# Patient Record
Sex: Male | Born: 1960 | Race: White | Hispanic: No | Marital: Married | Smoking: Current every day smoker
Health system: Southern US, Community
[De-identification: ages and names within clinical notes are randomized; demographics above are authoritative.]

## PROBLEM LIST (undated history)

## (undated) DIAGNOSIS — M1990 Unspecified osteoarthritis, unspecified site: Secondary | ICD-10-CM

## (undated) DIAGNOSIS — I10 Essential (primary) hypertension: Secondary | ICD-10-CM

## (undated) DIAGNOSIS — E785 Hyperlipidemia, unspecified: Secondary | ICD-10-CM

## (undated) DIAGNOSIS — K219 Gastro-esophageal reflux disease without esophagitis: Secondary | ICD-10-CM

## (undated) HISTORY — PX: CERVICAL DISC SURGERY: SHX588

## (undated) HISTORY — DX: Hyperlipidemia, unspecified: E78.5

## (undated) HISTORY — DX: Essential (primary) hypertension: I10

## (undated) HISTORY — DX: Gastro-esophageal reflux disease without esophagitis: K21.9

## (undated) HISTORY — DX: Unspecified osteoarthritis, unspecified site: M19.90

---

## 1990-08-10 HISTORY — PX: BLADDER SURGERY: SHX569

## 2005-02-12 ENCOUNTER — Ambulatory Visit: Payer: Self-pay | Admitting: Family Medicine

## 2005-04-10 ENCOUNTER — Emergency Department: Payer: Self-pay | Admitting: Emergency Medicine

## 2005-04-21 ENCOUNTER — Ambulatory Visit: Payer: Self-pay | Admitting: Internal Medicine

## 2005-05-21 ENCOUNTER — Ambulatory Visit: Payer: Self-pay | Admitting: Internal Medicine

## 2007-05-24 ENCOUNTER — Ambulatory Visit: Payer: Self-pay | Admitting: Chiropractic Medicine

## 2008-07-19 ENCOUNTER — Ambulatory Visit: Payer: Self-pay | Admitting: General Practice

## 2008-08-10 HISTORY — PX: NECK SURGERY: SHX720

## 2008-08-27 ENCOUNTER — Ambulatory Visit (HOSPITAL_COMMUNITY): Admission: RE | Admit: 2008-08-27 | Discharge: 2008-08-28 | Payer: Self-pay | Admitting: Neurosurgery

## 2008-10-02 ENCOUNTER — Encounter: Admission: RE | Admit: 2008-10-02 | Discharge: 2008-10-02 | Payer: Self-pay | Admitting: Neurosurgery

## 2008-11-02 ENCOUNTER — Encounter: Admission: RE | Admit: 2008-11-02 | Discharge: 2008-11-02 | Payer: Self-pay | Admitting: Neurosurgery

## 2010-11-24 LAB — CBC
Hemoglobin: 15.6 g/dL (ref 13.0–17.0)
RBC: 4.78 MIL/uL (ref 4.22–5.81)
WBC: 8.5 10*3/uL (ref 4.0–10.5)

## 2010-12-23 NOTE — Op Note (Signed)
NAMESOLLY, DERASMO NO.:  [id number]   MEDICAL RECORD NO.:  [id number]          PATIENT TYPE:  INP   LOCATION:  3536                         FACILITY:  [facility]   PHYSICIAN:  Donalee Citrin, M.D.        DATE OF BIRTH:  [birth year]   DATE OF PROCEDURE:  08/27/2008  DATE OF DISCHARGE:                               OPERATIVE REPORT   PREOPERATIVE DIAGNOSIS:  C6-C7 radiculopathy from cervical spondylosis  with stenosis of the C5-6 and C6-7.   PROCEDURE:  Anterior cervical diskectomy and fusion at C5-6 and C6-7  using 7-mm allograft wedges at C5-6 and C6-7, and a 40-mm venture plate  with six 30-mm variable screws.   SURGEON:  Donalee Citrin, MD   ASSISTANT:  Kathaleen Maser. Pool, MD   ANESTHESIA:  General endotracheal.   HISTORY OF PRESENT ILLNESS:  The patient is a very pleasant 50 year old  gentleman who has had progressive worsening neck and bilateral left arm  pain radiating to his shoulder, down to his forearm, to his thumb and  first fingers of his left hand.  The patient had weakness of his triceps  preoperatively and MRI scan showed severe spondylitic compression of the  C6 and C7 nerve roots respectively, worse on the left at C6-7, worse  than the right at C5-6 with severe stenosis of spinal cord compression  of both levels.  Given the patient's failure with conservative  treatment, MRI findings, and physical exam, the patient was recommended  anterior cervical discectomy with fusion.  Risks and benefits of the  operation were discussed with the patient and he understood and agreed  to proceed forward.   The patient was brought to the OR, was induced under general anesthesia,  positioned supine, with then neck in slight extension and 5 pounds of  halter traction.  The right side of the neck was prepped and draped in  routine sterile fashion.  Preop incision was localized at the  appropriate level.  A curvilinear incision was made and just off the  midline to the  anterior border of the sternocleidomastoid.  The  superficial layer of the platysma was dissected out and divided  longitudinally.  The avascular plane between the sternocleidomastoid and  strap muscle was developed down to the prevertebral fascia.  Prevertebral fascia was dissected with Kitners.  Intraoperative x-ray  confirmed localization of the C5-6 disk space, so the disk space was  incised with a 15-blade scalpel and used a marked anterior osteophytes  were bitten off the C5-6 and C6-7 vertebral bodies.  Then using a 2-mm  Kerrison punch, further anterior osteophytes were bitten off the  undersurface of the C5 and C6 vertebral bodies respectively and the both  interspaces were drilled down the posterior annulus and osteophyte  complex.  First working under microscope illumination at C6-7, the  posterior osteophyte complexes were drilled down.  Using a 1 to 2-mm  Kerrison punch, the undersurface of both C6 and C7 vertebral bodies were  underbitten.  The PLL was then identified and removed in piecemeal  fashion exposing the  thecal sac.  There was a marked spondylosis with  stenosis and compression spinal cord from the large osteophyte coming  from the posterior aspect of the C6 vertebral body.  This was  aggressively underbitten, marked and crossed through the uncinate  process, which was also hypertrophied,  and noticed to be displacing  both C7 neural foramina.  These uncinate was then underbitten.  The C7  pedicle was identified and C7 nerve root was skeletonized, flushed with  a pedicle, then aggressive underbiting was carried out the posterior  aspect of the C7 vertebral body until the central canal was  decompressed.  Gelfoam was placed and attention was taken to C5-6,  working with C5-6 in similar fashion.  A large osteophyte was coming off  the C5 vertebral body and the uncinate process superiorly on the right  size, this was all aggressively underbitten, decompressing the  right C6  nerve root.  They marked and crossed his left decompressing the left C6  nerve root and nerve hook easily passed along the foramen, flushed with  a pedicle.  After adequate decompression centrally had been achieved,  the endplates were scraped, a size 7-mm allograft wedge was inserted 1  mm deep to the anterior vertebral line both at C5-6 and C6-7, then a 40-  mm plate was sized, it was felt to be appropriate sizing six 30-mm  variable screws were placed.  All screws had excellent purchase and  locking mechanism was engaged.  Wound was then copiously irrigated and  meticulous hemostasis was maintained and the platysma was reapproximated  with interrupted Vicryl and the skin was closed with running 4-0  subcuticular, benzoin and Steri-Strips applied.  The patient went to  recovery room in stable condition.  At the end of the case, sponge,  needle, and instrument counts were correct.           ______________________________  Donalee Citrin, M.D.     GC/MEDQ  D:  08/27/2008  T:  08/28/2008  Job:  [id number]

## 2011-08-11 HISTORY — PX: SHOULDER SURGERY: SHX246

## 2011-10-17 ENCOUNTER — Ambulatory Visit: Payer: Self-pay | Admitting: General Practice

## 2011-12-10 ENCOUNTER — Encounter: Payer: Self-pay | Admitting: Orthopedic Surgery

## 2012-01-09 ENCOUNTER — Encounter: Payer: Self-pay | Admitting: Orthopedic Surgery

## 2012-02-08 ENCOUNTER — Encounter: Payer: Self-pay | Admitting: Orthopedic Surgery

## 2012-03-10 ENCOUNTER — Encounter: Payer: Self-pay | Admitting: Orthopedic Surgery

## 2012-04-10 ENCOUNTER — Encounter: Payer: Self-pay | Admitting: Orthopedic Surgery

## 2012-05-10 ENCOUNTER — Encounter: Payer: Self-pay | Admitting: Orthopedic Surgery

## 2012-07-06 ENCOUNTER — Ambulatory Visit: Payer: Self-pay | Admitting: General Practice

## 2012-08-01 ENCOUNTER — Ambulatory Visit: Payer: Self-pay | Admitting: General Practice

## 2014-01-11 LAB — HM COLONOSCOPY

## 2014-04-10 ENCOUNTER — Ambulatory Visit: Payer: Self-pay | Admitting: General Practice

## 2014-04-11 ENCOUNTER — Other Ambulatory Visit: Payer: Self-pay | Admitting: Neurology

## 2014-04-11 DIAGNOSIS — R519 Headache, unspecified: Secondary | ICD-10-CM | POA: Insufficient documentation

## 2014-04-11 DIAGNOSIS — R200 Anesthesia of skin: Secondary | ICD-10-CM | POA: Insufficient documentation

## 2014-04-11 DIAGNOSIS — R0789 Other chest pain: Secondary | ICD-10-CM | POA: Insufficient documentation

## 2014-04-11 DIAGNOSIS — R51 Headache: Secondary | ICD-10-CM

## 2014-04-11 LAB — TROPONIN I

## 2014-04-11 LAB — CK-MB: CK-MB: 1 ng/mL (ref 0.5–3.6)

## 2014-04-19 ENCOUNTER — Ambulatory Visit: Payer: Self-pay | Admitting: Internal Medicine

## 2014-11-16 ENCOUNTER — Ambulatory Visit
Admit: 2014-11-16 | Disposition: A | Discharge status: Home / Self Care | Payer: Self-pay | Attending: General Practice | Admitting: General Practice

## 2015-04-26 ENCOUNTER — Telehealth: Payer: Self-pay | Admitting: Family Medicine

## 2015-04-26 NOTE — Telephone Encounter (Signed)
Called pt's wife and made appt for 05/03/15 @ 3pm. Thanks TNP

## 2015-04-26 NOTE — Telephone Encounter (Signed)
That's fine

## 2015-04-26 NOTE — Telephone Encounter (Signed)
Pt's wife would like to have pt re-established back with Dr. Sherrie Mustache. I couldn't see how long it has been since pt was seen b/c it looks like it is a paper chart. Pt's wife Dois Davenport is a pt her and so is pt's father in law. Pt had been going to his employee clinic due to insurance but wife feels he needs a CPE with Dr. Sherrie Mustache. Insurance: [employer](?) Current Medications: Amlodipine 5 mg, Nexium, & Fenofibrate (Wife gave this list as best as she remembers). Current Medical problems: high BP, high cholesterol, & has seen a neurologist recently. Can we re-establish? Thanks TNP

## 2015-05-03 ENCOUNTER — Ambulatory Visit (INDEPENDENT_AMBULATORY_CARE_PROVIDER_SITE_OTHER): Payer: PRIVATE HEALTH INSURANCE | Admitting: Family Medicine

## 2015-05-03 ENCOUNTER — Encounter: Payer: Self-pay | Admitting: Family Medicine

## 2015-05-03 VITALS — BP 124/72 | HR 72 | Temp 98.6°F | Resp 16 | Ht 65.5 in | Wt 162.0 lb

## 2015-05-03 DIAGNOSIS — I10 Essential (primary) hypertension: Secondary | ICD-10-CM | POA: Insufficient documentation

## 2015-05-03 DIAGNOSIS — Z0000 Encounter for general adult medical examination without abnormal findings: Secondary | ICD-10-CM | POA: Diagnosis not present

## 2015-05-03 DIAGNOSIS — N400 Enlarged prostate without lower urinary tract symptoms: Secondary | ICD-10-CM | POA: Diagnosis not present

## 2015-05-03 DIAGNOSIS — E781 Pure hyperglyceridemia: Secondary | ICD-10-CM

## 2015-05-03 DIAGNOSIS — M7989 Other specified soft tissue disorders: Secondary | ICD-10-CM | POA: Diagnosis not present

## 2015-05-03 DIAGNOSIS — Z720 Tobacco use: Secondary | ICD-10-CM | POA: Diagnosis not present

## 2015-05-03 DIAGNOSIS — E782 Mixed hyperlipidemia: Secondary | ICD-10-CM | POA: Insufficient documentation

## 2015-05-03 DIAGNOSIS — Z23 Encounter for immunization: Secondary | ICD-10-CM | POA: Diagnosis not present

## 2015-05-03 MED ORDER — AMLODIPINE BESYLATE 5 MG PO TABS: 2.5000 mg | ORAL_TABLET | Freq: Every day | ORAL | Status: DC

## 2015-05-03 MED ORDER — DOXAZOSIN MESYLATE 2 MG PO TABS: 4.0000 mg | ORAL_TABLET | Freq: Every day | ORAL | Status: DC

## 2015-05-03 MED ORDER — BUPROPION HCL ER (SMOKING DET) 150 MG PO TB12: ORAL_TABLET | ORAL | Status: DC

## 2015-05-03 NOTE — Progress Notes (Signed)
Patient ID: Derek Fields, male   DOB: 1961/05/24, 54 y.o.   MRN: [id number]       Patient: Derek Fields, Male    DOB: 08-31-1960, 54 y.o.   MRN: [id number] Visit Date: 05/03/2015  Today's Provider: Mila Merry, MD   Chief Complaint  Patient presents with  . New Patient (Initial Visit)   Subjective:    Annual physical exam DEAUNDRE ALLSTON is a 54 y.o. male who presents today for health maintenance and complete physical. He feels well. He reports exercising not regularly. He reports he is sleeping well. Patient is here to re-establish with the practice. He reports that he used to see Dr. 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 Patient reports that he now has a different job, and would like to continue getting his care here at [facility].  -----------------------------------------------------------------   Review of Systems  Constitutional: Negative.   HENT: Negative.   Eyes: Negative.   Respiratory: Negative.   Cardiovascular: Negative.   Gastrointestinal: Negative.   Endocrine: Negative.   Genitourinary: Negative.   Musculoskeletal: Positive for neck pain.       No change from baseline. Patient has had neck surgery in 2010.   Skin: Negative.   Allergic/Immunologic: Negative.   Neurological: Negative.   Hematological: Negative.   Psychiatric/Behavioral: Negative.     Social History He  reports that he has been smoking Cigarettes.  He has a 20 pack-year smoking history. He has never used smokeless tobacco. He reports that he drinks about 2.4 oz of alcohol per week. He reports that he does not use illicit drugs. Social History   Social History  . Marital Status: Married    Spouse Name: N/A  . Number of Children: N/A  . Years of Education: N/A   Social History Main Topics  . Smoking status: Current Every Day Smoker -- 1.00 packs/day for 20 years    Types: Cigarettes  . Smokeless tobacco: Never Used  . Alcohol Use: 2.4 oz/week    4 Standard  drinks or equivalent per week  . Drug Use: No  . Sexual Activity: Not on file   Other Topics Concern  . Not on file   Social History Narrative  . No narrative on file    There are no active problems to display for this patient.   No past surgical history on file.  Family History  Family Status  Relation Status Death Age  . Mother Deceased   . Father Deceased    His family history includes Cancer in his father; Heart disease in his mother.    No Known Allergies  Previous Medications   AMLODIPINE (NORVASC) 5 MG TABLET    Take 5 mg by mouth daily.   DOXAZOSIN (CARDURA) 2 MG TABLET    Take 2 mg by mouth daily.   ESOMEPRAZOLE (NEXIUM) 40 MG CAPSULE    Take 40 mg by mouth daily.   FENOFIBRATE 160 MG TABLET    Take 160 mg by mouth daily.    Patient Care Team: Malva Limes, MD as PCP - General (Family Medicine)     Objective:   Vitals: BP 124/72 mmHg  Pulse 72  Temp(Src) 98.6 F (37 C)  Resp 16  Ht 5' 5.5" (1.664 m)  Wt 162 lb (73.483 kg)  BMI 26.54 kg/m2   Physical Exam  General Appearance:    Alert, cooperative, no distress, appears stated age  Head:    Normocephalic, without obvious abnormality,  atraumatic  Eyes:    PERRL, conjunctiva/corneas clear, EOM's intact, fundi    benign, both eyes       Ears:    Normal TM's and external ear canals, both ears  Nose:   Nares normal, septum midline, mucosa normal, no drainage   or sinus tenderness  Throat:   Lips, mucosa, and tongue normal; teeth and gums normal  Neck:   Supple, symmetrical, trachea midline, no adenopathy;       thyroid:  No enlargement/tenderness/nodules; no carotid   bruit or JVD  Back:     Symmetric, no curvature, ROM normal, no CVA tenderness  Lungs:     Clear to auscultation bilaterally, respirations unlabored  Chest wall:    No tenderness or deformity  Heart:    Regular rate and rhythm, S1 and S2 normal, no murmur, rub   or gallop  Abdomen:     Soft, non-tender, bowel sounds active all four  quadrants,    no masses, no organomegaly  Genitalia:    deferred  Rectal:    the prostate is enlarged at the bilateral, with an approx volume of 30 gms, negative bulge  Extremities:   Extremities normal, atraumatic, no cyanosis or edema. Vague swelling under left axilla with no discreet masses.   Pulses:   2+ and symmetric all extremities  Skin:   Skin color, texture, turgor normal, no rashes or lesions  Lymph nodes:   Cervical, supraclavicular  Neurologic:   CNII-XII intact. Normal strength, sensation and reflexes      throughout     Depression Screen No flowsheet data found.    Assessment & Plan:     Routine Health Maintenance and Physical Exam  Exercise Activities and Dietary recommendations Goals    None       There is no immunization history on file for this patient.  Health Maintenance  Topic Date Due  . Hepatitis C Screening  1961/06/13  . HIV Screening  06/13/1978  . TETANUS/TDAP  06/13/1982  . COLONOSCOPY  06/13/2013  . INFLUENZA VACCINE  03/11/2015      Discussed health benefits of physical activity, and encouraged him to engage in regular exercise appropriate for his age and condition.    --------------------------------------------------------------------  1. Annual physical exam  - Comprehensive metabolic panel - PSA  2. Essential hypertension Doing well on current dose of amlodipine. He needs to go up on dose of Cardura as below, so will reduce amlodipine.  - EKG 12-Lead - TSH - amLODipine (NORVASC) 5 MG tablet; Take 0.5 tablets (2.5 mg total) by mouth daily.  Dispense: 1 tablet; Refill: 3  3. Tobacco abuse Smoking cessation instruction/counseling given:  counseled patient on the dangers of tobacco use, advised patient to stop smoking, and reviewed strategies to maximize success - buPROPion (ZYBAN) 150 MG 12 hr tablet; 1 tablet daily for 3 days, then increase to 1 tablet twice daily. Stop smoking 2 weeks after starting medication  Dispense: 60  tablet; Refill: 3  4. Left axillary swelling Ultrasound axilla  5. Hypertriglyceridemia Did not tolerate atorvastatin. Doing well with fenofibrate.  - Lipid panel  6. BPH (benign prostatic hyperplasia) Not controlled on current dose of cardura. He is going to double up on dose and call for rx for  tablet if this is effective.  - doxazosin (CARDURA) 2 MG tablet; Take 2 tablets (4 mg total) by mouth daily.  Dispense: 30 tablet; Refill: 5  7. Need for Tdap vaccination - Tdap vaccine greater than or equal  to 7yo IM   8. Need for influenza vaccination - Flu Vaccine QUAD 36+ mos IM

## 2015-05-03 NOTE — Patient Instructions (Signed)
Please stop smoking 

## 2015-05-04 ENCOUNTER — Encounter: Payer: Self-pay | Admitting: Family Medicine

## 2015-05-09 ENCOUNTER — Encounter: Payer: Self-pay | Admitting: Family Medicine

## 2015-05-10 ENCOUNTER — Other Ambulatory Visit: Payer: Self-pay | Admitting: Family Medicine

## 2015-05-10 ENCOUNTER — Telehealth: Payer: Self-pay | Admitting: Family Medicine

## 2015-05-10 DIAGNOSIS — M7989 Other specified soft tissue disorders: Secondary | ICD-10-CM

## 2015-05-10 NOTE — Progress Notes (Signed)
US BREAST LTD UNI LEFT INC AXILLA

## 2015-05-10 NOTE — Telephone Encounter (Signed)
Thanks. That what I needed to know. Have cancelled previous orders and entered new order.

## 2015-05-10 NOTE — Telephone Encounter (Signed)
Per [facility] unless pt has swelling in the breast or chest area the test that should be ordered is a non vascular extremity ultrasound [id number].If you want to keep the test as a breast ultrasound then the diagnosis needs to be changed

## 2015-05-15 ENCOUNTER — Ambulatory Visit: Payer: PRIVATE HEALTH INSURANCE

## 2015-05-20 ENCOUNTER — Ambulatory Visit
Admission: RE | Admit: 2015-05-20 | Discharge: 2015-05-20 | Disposition: A | Discharge status: Home / Self Care | Payer: PRIVATE HEALTH INSURANCE | Source: Ambulatory Visit | Attending: Family Medicine | Admitting: Family Medicine

## 2015-05-20 DIAGNOSIS — M7989 Other specified soft tissue disorders: Secondary | ICD-10-CM | POA: Insufficient documentation

## 2015-05-21 ENCOUNTER — Telehealth: Payer: Self-pay

## 2015-05-21 LAB — COMPREHENSIVE METABOLIC PANEL
A/G RATIO: 2 (ref 1.1–2.5)
ALT: 45 IU/L — ABNORMAL HIGH (ref 0–44)
AST: 28 IU/L (ref 0–40)
Albumin: 4.5 g/dL (ref 3.5–5.5)
Alkaline Phosphatase: 63 IU/L (ref 39–117)
BILIRUBIN TOTAL: 0.2 mg/dL (ref 0.0–1.2)
BUN/Creatinine Ratio: 11 (ref 9–20)
BUN: 13 mg/dL (ref 6–24)
CALCIUM: 9.4 mg/dL (ref 8.7–10.2)
CO2: 25 mmol/L (ref 18–29)
Chloride: 101 mmol/L (ref 97–108)
Creatinine, Ser: 1.14 mg/dL (ref 0.76–1.27)
GFR, EST AFRICAN AMERICAN: 86 mL/min/{1.73_m2} (ref >59)
GFR, EST NON AFRICAN AMERICAN: 74 mL/min/{1.73_m2} (ref >59)
GLOBULIN, TOTAL: 2.2 g/dL (ref 1.5–4.5)
Glucose: 91 mg/dL (ref 65–99)
POTASSIUM: 4.1 mmol/L (ref 3.5–5.2)
SODIUM: 142 mmol/L (ref 134–144)
TOTAL PROTEIN: 6.7 g/dL (ref 6.0–8.5)

## 2015-05-21 LAB — TSH: TSH: 2.98 u[IU]/mL (ref 0.450–4.500)

## 2015-05-21 LAB — LIPID PANEL
CHOL/HDL RATIO: 6.2 ratio — AB (ref 0.0–5.0)
Cholesterol, Total: 341 mg/dL — ABNORMAL HIGH (ref 100–199)
HDL: 55 mg/dL (ref >39)
LDL CALC: 243 mg/dL — AB (ref 0–99)
Triglycerides: 214 mg/dL — ABNORMAL HIGH (ref 0–149)
VLDL Cholesterol Cal: 43 mg/dL — ABNORMAL HIGH (ref 5–40)

## 2015-05-21 LAB — PSA: Prostate Specific Ag, Serum: 1 ng/mL (ref 0.0–4.0)

## 2015-05-21 NOTE — Telephone Encounter (Signed)
Left message to call back  

## 2015-05-21 NOTE — Telephone Encounter (Signed)
-----   Message from Donald E Fisher, MD sent at 05/21/2015  8:14 AM EDT ----- Cholesterol is extremely high at 341. Recommend he change stop fenofibrate and start pravastatin 40mg daily, #30, rf x 1. Also take Conezyme Q10 200mg daily.  Follow up o.v. And labs in 6 weeks.  All other labs normal. 

## 2015-05-22 ENCOUNTER — Telehealth: Payer: Self-pay | Admitting: *Deleted

## 2015-05-22 MED ORDER — COENZYME Q-10 200 MG PO CAPS: 1.0000 | ORAL_CAPSULE | Freq: Every day | ORAL | Status: DC

## 2015-05-22 MED ORDER — PRAVASTATIN SODIUM 40 MG PO TABS: 40.0000 mg | ORAL_TABLET | Freq: Every day | ORAL | Status: DC

## 2015-05-22 NOTE — Telephone Encounter (Signed)
Patient notified of results. Patient expressed understanding. Rx was sent to pharmacy. 

## 2015-05-22 NOTE — Telephone Encounter (Signed)
-----   Message from Malva Limesonald E Fisher, MD sent at 05/21/2015  8:14 AM EDT ----- Cholesterol is extremely high at 341. Recommend he change stop fenofibrate and start pravastatin 40mg  daily, #30, rf x 1. Also take Conezyme Q10 200mg  daily.  Follow up o.v. And labs in 6 weeks.  All other labs normal.

## 2015-05-23 ENCOUNTER — Encounter: Payer: Self-pay | Admitting: Family Medicine

## 2015-05-23 NOTE — Telephone Encounter (Signed)
Patient notified of results. Patient expressed understanding. Rx was sent to pharmacy.

## 2015-05-29 ENCOUNTER — Other Ambulatory Visit: Payer: Self-pay | Admitting: Family Medicine

## 2015-07-22 ENCOUNTER — Other Ambulatory Visit: Payer: Self-pay | Admitting: Family Medicine

## 2015-07-22 DIAGNOSIS — E785 Hyperlipidemia, unspecified: Secondary | ICD-10-CM

## 2015-07-22 NOTE — Telephone Encounter (Signed)
Patient needs to schedule follow up o.v. And labs to see how pravastatin is working.

## 2015-08-02 NOTE — Telephone Encounter (Signed)
Orders printed and pt notified.

## 2015-08-02 NOTE — Telephone Encounter (Signed)
Pt made an appt. Would like to know if he needs his labs done before he comes and if so will need a lab slip and also pravastatin sent to make sure he has enough until his appt.

## 2015-08-02 NOTE — Telephone Encounter (Signed)
Please order fastin lipid panel and ALT so he can have labs drawn before his appointment. Have sent one refill to pharmacy.

## 2015-08-02 NOTE — Telephone Encounter (Signed)
LMTCB 08/02/2015 on home number;  I tried calling his cell number but he hung up on me.   Thanks,   -Vernona RiegerLaura

## 2015-08-14 ENCOUNTER — Ambulatory Visit: Payer: Self-pay | Admitting: Family Medicine

## 2015-08-21 ENCOUNTER — Ambulatory Visit: Payer: Self-pay | Admitting: Family Medicine

## 2015-08-28 ENCOUNTER — Other Ambulatory Visit: Payer: Self-pay | Admitting: Family Medicine

## 2015-08-28 ENCOUNTER — Ambulatory Visit: Payer: Self-pay | Admitting: Family Medicine

## 2015-08-28 ENCOUNTER — Telehealth: Payer: Self-pay

## 2015-08-28 LAB — ALT: ALT: 31 IU/L (ref 0–44)

## 2015-08-28 LAB — LIPID PANEL
CHOL/HDL RATIO: 4.5 ratio (ref 0.0–5.0)
Cholesterol, Total: 266 mg/dL — ABNORMAL HIGH (ref 100–199)
HDL: 59 mg/dL (ref >39)
LDL CALC: 144 mg/dL — AB (ref 0–99)
Triglycerides: 313 mg/dL — ABNORMAL HIGH (ref 0–149)
VLDL Cholesterol Cal: 63 mg/dL — ABNORMAL HIGH (ref 5–40)

## 2015-08-28 MED ORDER — PRAVASTATIN SODIUM 80 MG PO TABS: 80.0000 mg | ORAL_TABLET | Freq: Every day | ORAL | Status: DC

## 2015-08-28 NOTE — Telephone Encounter (Signed)
Advised patient as below. Scheduled patient appt in 8 weeks to have cholesterol and BP repeated. Med sent into pharmacy.

## 2015-08-28 NOTE — Telephone Encounter (Signed)
-----   Message from Malva Limes, MD sent at 08/28/2015  8:07 AM EST ----- Cholesterol is much better, down from 341 to 266. Should be under 200. Need to increase pravastatin to to  daily, #30, rf x 2. Need to schedule follow up for BP check and cholesterol in 6-8 weeks.

## 2015-10-14 ENCOUNTER — Other Ambulatory Visit: Payer: Self-pay | Admitting: Family Medicine

## 2015-10-25 ENCOUNTER — Ambulatory Visit: Payer: Self-pay | Admitting: Family Medicine

## 2015-12-31 ENCOUNTER — Other Ambulatory Visit: Payer: Self-pay | Admitting: Family Medicine

## 2016-03-16 ENCOUNTER — Other Ambulatory Visit: Payer: Self-pay | Admitting: Family Medicine

## 2016-06-02 ENCOUNTER — Other Ambulatory Visit: Payer: Self-pay | Admitting: Family Medicine

## 2016-06-03 ENCOUNTER — Other Ambulatory Visit: Payer: Self-pay | Admitting: Family Medicine

## 2016-08-07 ENCOUNTER — Other Ambulatory Visit: Payer: Self-pay | Admitting: Family Medicine

## 2016-08-29 ENCOUNTER — Other Ambulatory Visit: Payer: Self-pay | Admitting: Family Medicine

## 2016-08-31 ENCOUNTER — Other Ambulatory Visit: Payer: Self-pay | Admitting: Family Medicine

## 2016-09-23 ENCOUNTER — Other Ambulatory Visit: Payer: Self-pay | Admitting: Family Medicine

## 2016-09-30 ENCOUNTER — Other Ambulatory Visit: Payer: Self-pay | Admitting: Family Medicine

## 2016-10-07 ENCOUNTER — Ambulatory Visit (INDEPENDENT_AMBULATORY_CARE_PROVIDER_SITE_OTHER): Payer: Managed Care, Other (non HMO) | Admitting: Family Medicine

## 2016-10-07 ENCOUNTER — Encounter: Payer: Self-pay | Admitting: Family Medicine

## 2016-10-07 VITALS — BP 140/90 | HR 77 | Temp 97.7°F | Resp 16 | Ht 65.0 in | Wt 166.0 lb

## 2016-10-07 DIAGNOSIS — N401 Enlarged prostate with lower urinary tract symptoms: Secondary | ICD-10-CM

## 2016-10-07 DIAGNOSIS — Z1159 Encounter for screening for other viral diseases: Secondary | ICD-10-CM | POA: Diagnosis not present

## 2016-10-07 DIAGNOSIS — R339 Retention of urine, unspecified: Secondary | ICD-10-CM

## 2016-10-07 DIAGNOSIS — K429 Umbilical hernia without obstruction or gangrene: Secondary | ICD-10-CM

## 2016-10-07 DIAGNOSIS — E781 Pure hyperglyceridemia: Secondary | ICD-10-CM | POA: Diagnosis not present

## 2016-10-07 DIAGNOSIS — R140 Abdominal distension (gaseous): Secondary | ICD-10-CM | POA: Insufficient documentation

## 2016-10-07 DIAGNOSIS — Z0000 Encounter for general adult medical examination without abnormal findings: Secondary | ICD-10-CM | POA: Diagnosis not present

## 2016-10-07 DIAGNOSIS — I10 Essential (primary) hypertension: Secondary | ICD-10-CM

## 2016-10-07 NOTE — Patient Instructions (Signed)
   Recommend taking 81mg enteric coated aspirin to reduce risk of vascular events such as heart attacks and strokes.    

## 2016-10-07 NOTE — Progress Notes (Signed)
Patient: Derek Fields, Male    DOB: 18-Dec-1960, 56 y.o.   MRN: [id number] Visit Date: 10/07/2016  Today's Provider: Mila Merry, MD   Chief Complaint  Patient presents with  . Annual Exam  . Hypertension    follow up  . Hyperlipidemia    follow up  . Benign Prostatic Hypertrophy    follow up   Subjective:    Annual physical exam Derek Fields is a 56 y.o. male who presents today for health maintenance and complete physical. He feels fairly well. He reports never exercising. He reports he is sleeping fairly well.  -----------------------------------------------------------------  Hypertension, follow-up:  BP Readings from Last 3 Encounters:  05/03/15 124/72    He was last seen for hypertension 2 years ago.  BP at that visit was 124/72. Management since that visit includes decreasing Amlodipine due to increased dose of Cardura. He reports good compliance with treatment. He is not having side effects.  He is not exercising. He is not adherent to low salt diet.   Outside blood pressures are not being checked. He is experiencing none.  Patient denies chest pain, chest pressure/discomfort, claudication, dyspnea, exertional chest pressure/discomfort, fatigue, irregular heart beat, lower extremity edema, near-syncope, orthopnea, palpitations, paroxysmal nocturnal dyspnea, syncope and tachypnea.   Cardiovascular risk factors include dyslipidemia, hypertension and male gender.  Use of agents associated with hypertension: none.     Weight trend: fluctuating a bit Wt Readings from Last 3 Encounters:  05/03/15 162 lb (73.5 kg)    Current diet: in general, an "unhealthy" diet  ------------------------------------------------------------------------  Lipid/Cholesterol, Follow-up:   Last seen for this1 years ago.  Management changes since that visit include increasing Pravastatin to 80mg  daily. . Last Lipid Panel:    Component Value Date/Time   CHOL 266 (H)  08/27/2015 0831   TRIG 313 (H) 08/27/2015 0831   HDL 59 08/27/2015 0831   CHOLHDL 4.5 08/27/2015 0831   LDLCALC 144 (H) 08/27/2015 0831    Risk factors for vascular disease include hypercholesterolemia and hypertension  He reports good compliance with treatment. He is not having side effects.  Current symptoms include none and have been stable. Weight trend: fluctuating a bit Prior visit with dietician: no Current diet: in general, an "unhealthy" diet Current exercise: none  Wt Readings from Last 3 Encounters:  05/03/15 162 lb (73.5 kg)    ------------------------------------------------------------------- Follow up of BPH:  Patient was last seen for this problem 05/03/2015. Changes made during that visit includes doubling up on his dose of Cardura. However he states he still has trouble with slow urine flow, hesitancy and urgency.   Complains of stomach feeling bloated every day and uncomfortable.   Follow up Tobacco abuse:  Patient was last seen for this problem 05/03/2015. Management during that visit includes giving patient a prescription for Zyban. Patient comes in today reporting that he never started the Zyban due to him reading of possible side effects such as Depression. Patient still smokes.   Review of Systems  Constitutional: Negative for appetite change, chills and fever.  Respiratory: Positive for shortness of breath. Negative for chest tightness and wheezing.   Cardiovascular: Negative for chest pain and palpitations.  Gastrointestinal: Positive for abdominal distention, abdominal pain, anal bleeding and rectal pain. Negative for nausea and vomiting.  Genitourinary: Positive for dysuria and frequency.  Musculoskeletal: Positive for arthralgias.    Social History      He  reports that he has been smoking Cigarettes.  He has a 20.00 pack-year smoking history. He has never used smokeless tobacco. He reports that he drinks about 2.4 oz of alcohol per week . He  reports that he does not use drugs.       Social History   Social History  . Marital status: Married    Spouse name: N/A  . Number of children: 1  . Years of education: N/A   Social History Main Topics  . Smoking status: Current Every Day Smoker    Packs/day: 1.00    Years: 20.00    Types: Cigarettes  . Smokeless tobacco: Never Used     Comment: Started age 89 1 ppd, quit 10 years, restarted  age 63.   Marland Kitchen Alcohol use 2.4 oz/week    4 Standard drinks or equivalent per week  . Drug use: No  . Sexual activity: Not Asked   Other Topics Concern  . None   Social History Narrative  . None    Past Medical History:  Diagnosis Date  . Hyperlipidemia   . Hypertension      Patient Active Problem List   Diagnosis Date Noted  . Essential hypertension 05/03/2015  . Tobacco abuse 05/03/2015  . Left axillary swelling 05/03/2015  . BPH (benign prostatic hyperplasia) 05/03/2015  . Hypertriglyceridemia 05/03/2015  . Headache 04/11/2014  . Circumoral numbness 04/11/2014  . Arm numbness 04/11/2014    Past Surgical History:  Procedure Laterality Date  . BLADDER SURGERY  1992   "trimmed up" bladder outlet  . NECK SURGERY  2010  . SHOULDER SURGERY  2013   right shoulder    Family History        Family Status  Relation Status  . Mother Deceased  . Father Deceased  . Sister Alive  . Brother Alive  . Sister Alive  . Brother Alive  . Brother Alive  . Brother Alive  . Brother Alive        His family history includes Cancer in his father; Heart disease in his mother.     No Known Allergies   Current Outpatient Prescriptions:  .  amLODipine (NORVASC) 5 MG tablet, TAKE 1 TABLET BY MOUTH EVERY DAY, Disp: 30 tablet, Rfl: 2 .  Coenzyme Q-10 200 MG CAPS, Take 1 capsule (200 mg total) by mouth daily., Disp: , Rfl:  .  doxazosin (CARDURA) 2 MG tablet, TAKE 2 TABLETS BY MOUTH EVERY DAY PATIENT NEEDS TO SCHEDULE OFFICE VISIT FOR FOLLOW UP, Disp: 30 tablet, Rfl: 0 .  esomeprazole  (NEXIUM) 40 MG capsule, TAKE ONE CAPSULE BY MOUTH EVERY DAY, Disp: 30 capsule, Rfl: 9 .  pravastatin (PRAVACHOL) 80 MG tablet, TAKE 1 TABLET BY MOUTH DAILY., Disp: 30 tablet, Rfl: 0   Patient Care Team: Malva Limes, MD as PCP - General (Family Medicine)      Objective:   Vitals: BP 140/90 (BP Location: Left Arm, Patient Position: Sitting, Cuff Size: Large)   Pulse 77   Temp 97.7 F (36.5 C) (Oral)   Resp 16   Ht 5\' 5"  (1.651 m)   Wt 166 lb (75.3 kg)   SpO2 96%   BMI 27.62 kg/m    Physical Exam   General Appearance:    Alert, cooperative, no distress, appears stated age  Head:    Normocephalic, without obvious abnormality, atraumatic  Eyes:    PERRL, conjunctiva/corneas clear, EOM's intact, fundi    benign, both eyes       Ears:    Normal TM's and  external ear canals, both ears  Nose:   Nares normal, septum midline, mucosa normal, no drainage   or sinus tenderness  Throat:   Lips, mucosa, and tongue normal; teeth and gums normal  Neck:   Supple, symmetrical, trachea midline, no adenopathy;       thyroid:  No enlargement/tenderness/nodules; no carotid   bruit or JVD  Back:     Symmetric, no curvature, ROM normal, no CVA tenderness  Lungs:     Clear to auscultation bilaterally, respirations unlabored  Chest wall:    No tenderness or deformity  Heart:    Regular rate and rhythm, S1 and S2 normal, no murmur, rub   or gallop  Abdomen:     Soft, non-tender, bowel sounds active all four quadrants,    no masses, no organomegaly  Genitalia:    deferred  Rectal:    deferred  Extremities:   Extremities normal, atraumatic, no cyanosis or edema  Pulses:   2+ and symmetric all extremities  Skin:   Skin color, texture, turgor normal, no rashes or lesions  Lymph nodes:   Cervical, supraclavicular, and axillary nodes normal  Neurologic:   CNII-XII intact. Normal strength, sensation and reflexes      throughout    Depression Screen PHQ 2/9 Scores 10/07/2016  PHQ - 2 Score 0    PHQ- 9 Score 0   Current Exercise Habits: The patient does not participate in regular exercise at present Exercise limited by: None identified     Assessment & Plan:     Routine Health Maintenance and Physical Exam  Exercise Activities and Dietary recommendations Goals    None      Immunization History  Administered Date(s) Administered  . Influenza,inj,Quad PF,36+ Mos 05/03/2015  . Tdap 05/03/2015    Health Maintenance  Topic Date Due  . Hepatitis C Screening  03/14/1963  . HIV Screening  06/13/1978  . INFLUENZA VACCINE  03/10/2016  . COLONOSCOPY  01/12/2024  . TETANUS/TDAP  05/02/2025     Discussed health benefits of physical activity, and encouraged him to engage in regular exercise appropriate for his age and condition.    --------------------------------------------------------------------  1. Annual physical exam  - Comprehensive metabolic panel - Lipid panel - PSA  2. Urinary retention Likely due to BPH. If PSA normal will try increasing dose of doxazocin.   3. Benign prostatic hyperplasia with lower urinary tract symptoms, symptom details unspecified   4. Essential hypertension Stable. May benefit from increased dose of doxazocin. . - EKG 12-Lead  5. Hypertriglyceridemia He is tolerating pravastatin well with no adverse effects.    6. Umbilical hernia without obstruction and without gangrene   7. Bloating Consider abdominal u/s if labs normal.   8. Need for hepatitis C screening test  - Hepatitis C antibody   Mila Merryonald Fisher, MD  [facility]

## 2016-10-08 ENCOUNTER — Encounter: Payer: Self-pay | Admitting: Family Medicine

## 2016-10-08 LAB — COMPREHENSIVE METABOLIC PANEL
A/G RATIO: 2.3 — AB (ref 1.2–2.2)
ALT: 36 IU/L (ref 0–44)
AST: 26 IU/L (ref 0–40)
Albumin: 5.2 g/dL (ref 3.5–5.5)
Alkaline Phosphatase: 82 IU/L (ref 39–117)
BILIRUBIN TOTAL: 0.3 mg/dL (ref 0.0–1.2)
BUN / CREAT RATIO: 8 — AB (ref 9–20)
BUN: 8 mg/dL (ref 6–24)
CHLORIDE: 98 mmol/L (ref 96–106)
CO2: 26 mmol/L (ref 18–29)
Calcium: 10 mg/dL (ref 8.7–10.2)
Creatinine, Ser: 1.05 mg/dL (ref 0.76–1.27)
GFR calc non Af Amer: 81 mL/min/{1.73_m2} (ref >59)
GFR, EST AFRICAN AMERICAN: 93 mL/min/{1.73_m2} (ref >59)
Globulin, Total: 2.3 g/dL (ref 1.5–4.5)
Glucose: 88 mg/dL (ref 65–99)
Potassium: 4.9 mmol/L (ref 3.5–5.2)
Sodium: 143 mmol/L (ref 134–144)
TOTAL PROTEIN: 7.5 g/dL (ref 6.0–8.5)

## 2016-10-08 LAB — LIPID PANEL
CHOLESTEROL TOTAL: 253 mg/dL — AB (ref 100–199)
Chol/HDL Ratio: 4.9 ratio units (ref 0.0–5.0)
HDL: 52 mg/dL (ref >39)
LDL Calculated: 145 mg/dL — ABNORMAL HIGH (ref 0–99)
Triglycerides: 282 mg/dL — ABNORMAL HIGH (ref 0–149)
VLDL Cholesterol Cal: 56 mg/dL — ABNORMAL HIGH (ref 5–40)

## 2016-10-08 LAB — PSA: PROSTATE SPECIFIC AG, SERUM: 1.2 ng/mL (ref 0.0–4.0)

## 2016-10-08 LAB — HEPATITIS C ANTIBODY: Hep C Virus Ab: <0.1 s/co ratio (ref 0.0–0.9)

## 2016-10-14 ENCOUNTER — Encounter: Payer: Self-pay | Admitting: Physician Assistant

## 2016-10-14 ENCOUNTER — Ambulatory Visit: Payer: Self-pay | Admitting: Physician Assistant

## 2016-10-14 VITALS — BP 150/98 | HR 73 | Temp 98.2°F

## 2016-10-14 DIAGNOSIS — J0100 Acute maxillary sinusitis, unspecified: Secondary | ICD-10-CM

## 2016-10-14 MED ORDER — AZITHROMYCIN 250 MG PO TABS: ORAL_TABLET | ORAL | 0 refills | Status: DC

## 2016-10-14 MED ORDER — FLUTICASONE PROPIONATE 50 MCG/ACT NA SUSP: 2.0000 | Freq: Every day | NASAL | 6 refills | Status: DC

## 2016-10-14 NOTE — Progress Notes (Signed)
S: C/o runny nose and congestion for 2-3 days, no fever, chills, cp/sob, v/d; mucus is green and thick, cough is sporadic, throat is itchy, c/o of facial and dental pain. Father in law is on dialysis and lives with them  Using otc meds: saline nasal rinse  O: PE: vitals wnl, nad, perrl eomi, normocephalic, tms dull, nasal mucosa red and swollen, throat injected, neck supple no lymph, lungs c t a, cv rrr, neuro intact  A:  Acute sinusitis   P: drink fluids, continue regular meds , use otc meds of choice, return if not improving in 5 days, return earlier if worsening , zpack, flonase

## 2016-10-15 ENCOUNTER — Encounter: Payer: Self-pay | Admitting: Family Medicine

## 2016-10-18 ENCOUNTER — Other Ambulatory Visit: Payer: Self-pay | Admitting: Family Medicine

## 2016-10-26 ENCOUNTER — Other Ambulatory Visit: Payer: Self-pay | Admitting: Family Medicine

## 2016-11-11 ENCOUNTER — Ambulatory Visit: Payer: Self-pay | Admitting: Physician Assistant

## 2016-11-11 ENCOUNTER — Encounter: Payer: Self-pay | Admitting: Physician Assistant

## 2016-11-11 VITALS — BP 165/100 | HR 90 | Temp 98.3°F

## 2016-11-11 DIAGNOSIS — M436 Torticollis: Secondary | ICD-10-CM

## 2016-11-11 MED ORDER — METHYLPREDNISOLONE 4 MG PO TBPK: ORAL_TABLET | ORAL | 0 refills | Status: DC

## 2016-11-11 MED ORDER — BACLOFEN 10 MG PO TABS: 10.0000 mg | ORAL_TABLET | Freq: Three times a day (TID) | ORAL | 0 refills | Status: DC

## 2016-11-11 NOTE — Progress Notes (Signed)
S: c/o r sided neck and shoulder pain for a week, radiates to r side of head, no numbness or tingling, will wake up with stinging pain into r hand occasionally, no known injury, states he woke up and thought he had a "crick" in his neck; hx of c-spine surgery by Dr Denman George: vitals wnl, nad, cspine is a little tender, decreased rom with hyperextension, r shoulder is spasmed, grips = b/l, n/v intact  A: torticollis  P: medrol dose pack, baclofen, f/u with surgeon if not better with medication, also consider massage therapy, use wet heat

## 2017-08-17 ENCOUNTER — Ambulatory Visit: Payer: Self-pay | Admitting: Emergency Medicine

## 2017-08-17 VITALS — BP 120/80 | HR 71 | Temp 98.2°F | Resp 16

## 2017-08-17 DIAGNOSIS — J209 Acute bronchitis, unspecified: Secondary | ICD-10-CM

## 2017-08-17 MED ORDER — BENZONATATE 100 MG PO CAPS: 100.0000 mg | ORAL_CAPSULE | Freq: Three times a day (TID) | ORAL | 0 refills | Status: DC | PRN

## 2017-08-17 MED ORDER — DOXYCYCLINE HYCLATE 100 MG PO CAPS: 100.0000 mg | ORAL_CAPSULE | Freq: Two times a day (BID) | ORAL | 0 refills | Status: DC

## 2017-08-17 MED ORDER — FLUTICASONE PROPIONATE 50 MCG/ACT NA SUSP: 2.0000 | Freq: Every day | NASAL | 6 refills | Status: DC

## 2017-08-17 NOTE — Progress Notes (Signed)
Subjective.  Patient enters with onset this  Weekend of headaches sore throat and productive cough. He has been taking over-the-count medication  Without  Impvement. He does smoke.  objective.  HEENT exam reveals nasal conestion.  Throat normal.  Neck supple wihtout adenopathy  chest clear to auscultation..  Assessment.  Upper respiratory infection with bronchitis   plan.  Doxycycline 100 twice a day.    Tessalon Perles. Flonase

## 2017-08-31 ENCOUNTER — Other Ambulatory Visit: Payer: Self-pay | Admitting: Family Medicine

## 2017-10-17 ENCOUNTER — Encounter: Payer: Self-pay | Admitting: Emergency Medicine

## 2017-10-17 ENCOUNTER — Emergency Department
Admission: EM | Admit: 2017-10-17 | Discharge: 2017-10-17 | Disposition: A | Discharge status: Home / Self Care | Payer: Managed Care, Other (non HMO) | Attending: Emergency Medicine | Admitting: Emergency Medicine

## 2017-10-17 ENCOUNTER — Other Ambulatory Visit: Payer: Self-pay

## 2017-10-17 DIAGNOSIS — Z79899 Other long term (current) drug therapy: Secondary | ICD-10-CM | POA: Diagnosis not present

## 2017-10-17 DIAGNOSIS — I10 Essential (primary) hypertension: Secondary | ICD-10-CM | POA: Diagnosis not present

## 2017-10-17 DIAGNOSIS — F17210 Nicotine dependence, cigarettes, uncomplicated: Secondary | ICD-10-CM | POA: Insufficient documentation

## 2017-10-17 DIAGNOSIS — T783XXA Angioneurotic edema, initial encounter: Secondary | ICD-10-CM | POA: Diagnosis not present

## 2017-10-17 DIAGNOSIS — R220 Localized swelling, mass and lump, head: Secondary | ICD-10-CM | POA: Diagnosis present

## 2017-10-17 LAB — CBC WITH DIFFERENTIAL/PLATELET
BASOS PCT: 1 %
Basophils Absolute: 0.1 10*3/uL (ref 0–0.1)
Eosinophils Absolute: 0.3 10*3/uL (ref 0–0.7)
Eosinophils Relative: 3 %
HCT: 46.7 % (ref 40.0–52.0)
Hemoglobin: 15.8 g/dL (ref 13.0–18.0)
LYMPHS PCT: 19 %
Lymphs Abs: 2.2 10*3/uL (ref 1.0–3.6)
MCH: 33.1 pg (ref 26.0–34.0)
MCHC: 33.9 g/dL (ref 32.0–36.0)
MCV: 97.7 fL (ref 80.0–100.0)
MONO ABS: 1.4 10*3/uL — AB (ref 0.2–1.0)
MONOS PCT: 12 %
NEUTROS ABS: 7.6 10*3/uL — AB (ref 1.4–6.5)
Neutrophils Relative %: 65 %
Platelets: 301 10*3/uL (ref 150–440)
RBC: 4.78 MIL/uL (ref 4.40–5.90)
RDW: 13.6 % (ref 11.5–14.5)
WBC: 11.6 10*3/uL — ABNORMAL HIGH (ref 3.8–10.6)

## 2017-10-17 LAB — COMPREHENSIVE METABOLIC PANEL
ALT: 51 U/L (ref 17–63)
ANION GAP: 11 (ref 5–15)
AST: 29 U/L (ref 15–41)
Albumin: 5.1 g/dL — ABNORMAL HIGH (ref 3.5–5.0)
Alkaline Phosphatase: 110 U/L (ref 38–126)
BUN: 12 mg/dL (ref 6–20)
CO2: 27 mmol/L (ref 22–32)
Calcium: 9.6 mg/dL (ref 8.9–10.3)
Chloride: 103 mmol/L (ref 101–111)
Creatinine, Ser: 0.99 mg/dL (ref 0.61–1.24)
GFR calc Af Amer: >60 mL/min (ref >60)
Glucose, Bld: 102 mg/dL — ABNORMAL HIGH (ref 65–99)
POTASSIUM: 3.9 mmol/L (ref 3.5–5.1)
Sodium: 141 mmol/L (ref 135–145)
TOTAL PROTEIN: 8.6 g/dL — AB (ref 6.5–8.1)
Total Bilirubin: 0.7 mg/dL (ref 0.3–1.2)

## 2017-10-17 MED ORDER — DIPHENHYDRAMINE HCL 50 MG/ML IJ SOLN
INTRAMUSCULAR | Status: DC
Start: 2017-10-17 — End: 2017-10-17
  Filled 2017-10-17: qty 1

## 2017-10-17 MED ORDER — EPINEPHRINE 0.3 MG/0.3ML IJ SOAJ: 0.3000 mg | Freq: Once | INTRAMUSCULAR | 1 refills | Status: AC

## 2017-10-17 MED ORDER — DIPHENHYDRAMINE HCL 50 MG/ML IJ SOLN
50.0000 mg | Freq: Once | INTRAMUSCULAR | Status: AC
  Administered 2017-10-17: 50 mg via INTRAVENOUS

## 2017-10-17 MED ORDER — METHYLPREDNISOLONE SODIUM SUCC 125 MG IJ SOLR
INTRAMUSCULAR | Status: AC
  Filled 2017-10-17: qty 2

## 2017-10-17 MED ORDER — RANITIDINE HCL 300 MG PO TABS: 300.0000 mg | ORAL_TABLET | Freq: Every day | ORAL | 1 refills | Status: DC

## 2017-10-17 MED ORDER — METHYLPREDNISOLONE SODIUM SUCC 125 MG IJ SOLR
125.0000 mg | Freq: Once | INTRAMUSCULAR | Status: AC
  Administered 2017-10-17: 125 mg via INTRAVENOUS

## 2017-10-17 MED ORDER — FAMOTIDINE IN NACL 20-0.9 MG/50ML-% IV SOLN
20.0000 mg | Freq: Once | INTRAVENOUS | Status: AC
  Administered 2017-10-17: 20 mg via INTRAVENOUS

## 2017-10-17 MED ORDER — FAMOTIDINE IN NACL 20-0.9 MG/50ML-% IV SOLN
INTRAVENOUS | Status: AC
  Administered 2017-10-17: 20 mg via INTRAVENOUS
  Filled 2017-10-17: qty 50

## 2017-10-17 MED ORDER — PREDNISONE 20 MG PO TABS: 20.0000 mg | ORAL_TABLET | Freq: Every day | ORAL | 0 refills | Status: DC

## 2017-10-17 NOTE — Discharge Instructions (Signed)
please take the steroids for the next couple days. Use Benadryl 2 of  the over-the-counter pills 4 times a day if you have any swelling left and Zantac. if you are taking the Benadryl. Please be sure to follow up with an allergist. if you develop swelling and shortness of breath use the EpiPen immediately. Then call 911. Return here if swelling returns but it's not causing any shortness of breath. Make sure you carry the EpiPen with you all the time just in case.

## 2017-10-17 NOTE — ED Notes (Signed)
Pt states he is feeling better at this time, feels like swelling is resolving in his tongue.

## 2017-10-17 NOTE — ED Notes (Signed)
NAD noted at time of D/C. Pt denies questions or concerns. Pt ambulatory to the lobby at this time.  

## 2017-10-17 NOTE — ED Triage Notes (Signed)
Pt presents to ED via POV, ambulatory from triage with c/o L sided neck swelling and pain. Pt states had 1 episode last week that resolved on its own, however woke up this morning with some tingling to his tongue that turned into swelling. Pt with mild noted slurred speech due to tongue swelling.

## 2017-10-17 NOTE — ED Provider Notes (Signed)
[facility] Emergency Department Provider Note   ____________________________________________   First MD Initiated Contact with Patient 10/17/17 1018     (approximate)  I have reviewed the triage vital signs and the nursing notes.   HISTORY  Chief Complaint Facial Swelling Chief complaint is swelling of the tongue  HPI Derek Fields is a 57 y.o. male who reports on Monday he had some swelling of his tongue and his lips that went down spontaneously And one other time since then and now again today worse than ever before his left side of his tongue is began to swell and he has some swelling of his left side of his face as well. He is not itching any where he is not short of breath and swallowing just can't speak well because of this tongue swelling. He is not taking any Ace inhibitors has not used any new foods or detergents is unsure why this is happening.in the past has had numbness around his mouth and neurology felt was due to "tearing of the blood vessels in his brain".   Past Medical History:  Diagnosis Date  . Hyperlipidemia   . Hypertension     Patient Active Problem List   Diagnosis Date Noted  . Umbilical hernia 10/07/2016  . Bloating 10/07/2016  . Essential hypertension 05/03/2015  . Tobacco abuse 05/03/2015  . Left axillary swelling 05/03/2015  . BPH (benign prostatic hyperplasia) 05/03/2015  . Mixed hyperlipidemia 05/03/2015  . Headache 04/11/2014  . Circumoral numbness 04/11/2014  . Arm numbness 04/11/2014    Past Surgical History:  Procedure Laterality Date  . BLADDER SURGERY  1992   "trimmed up" bladder outlet  . NECK SURGERY  2010  . SHOULDER SURGERY  2013   right shoulder    Prior to Admission medications   Medication Sig Start Date End Date Taking? Authorizing Provider  amLODipine (NORVASC) 5 MG tablet TAKE 1 TABLET BY MOUTH EVERY DAY 06/03/16  Yes Malva Limes, MD  doxazosin (CARDURA) 2 MG tablet Take 2 tablets (4 mg  total) by mouth daily. 10/18/16  Yes Malva Limes, MD  esomeprazole (NEXIUM) 40 MG capsule TAKE ONE CAPSULE BY MOUTH EVERY DAY 08/31/17  Yes Malva Limes, MD  fluticasone (FLONASE) 50 MCG/ACT nasal spray Place 2 sprays into both nostrils daily. 08/17/17  Yes Collene Gobble, MD  pravastatin (PRAVACHOL) 80 MG tablet TAKE 1 TABLET BY MOUTH DAILY. 10/26/16  Yes Malva Limes, MD  benzonatate (TESSALON) 100 MG capsule Take 1-2 capsules (100-200 mg total) by mouth 3 (three) times daily as needed for cough. Patient not taking: Reported on 10/17/2017 08/17/17   Collene Gobble, MD  doxycycline (VIBRAMYCIN) 100 MG capsule Take 1 capsule (100 mg total) by mouth 2 (two) times daily. Patient not taking: Reported on 10/17/2017 08/17/17   Collene Gobble, MD  EPINEPHrine (EPIPEN 2-PAK) 0.3 mg/0.3 mL IJ SOAJ injection Inject 0.3 mLs (0.3 mg total) into the muscle once for 1 dose. 10/17/17 10/17/17  Arnaldo Natal, MD  predniSONE (DELTASONE) 20 MG tablet Take 1 tablet (20 mg total) by mouth daily. 10/17/17 10/17/18  Arnaldo Natal, MD  ranitidine (ZANTAC) 300 MG tablet Take 1 tablet (300 mg total) by mouth at bedtime. 10/17/17 10/17/18  Arnaldo Natal, MD    Allergies Patient has no known allergies.  Family History  Problem Relation Age of Onset  . Heart disease Mother   . Cancer Father  Brain cancer.    Social History Social History   Tobacco Use  . Smoking status: Current Every Day Smoker    Packs/day: 1.00    Years: 20.00    Pack years: 20.00    Types: Cigarettes  . Smokeless tobacco: Never Used  . Tobacco comment: Stareted age 57 1ppd, quit 10 years, restarted  age 57.   Substance Use Topics  . Alcohol use: Yes    Alcohol/week: 2.4 oz    Types: 4 Standard drinks or equivalent per week  . Drug use: No    Review of Systems  Constitutional: No fever/chills Eyes: No visual changes. ENT: No sore throat. Cardiovascular: Denies chest pain. Respiratory: Denies shortness of  breath. Gastrointestinal: No abdominal pain.  No nausea, no vomiting.  No diarrhea.  No constipation. Genitourinary: Negative for dysuria. Musculoskeletal: Negative for back pain. Skin: Negative for rash. Neurological: Negative for headaches, focal weakness ____________________________________________   PHYSICAL EXAM:  VITAL SIGNS: ED Triage Vitals  Enc Vitals Group     BP      Pulse      Resp      Temp      Temp src      SpO2      Weight      Height      Head Circumference      Peak Flow      Pain Score      Pain Loc      Pain Edu?      Excl. in GC?     Constitutional: Alert and oriented. Well appearing and in no acute distress. Eyes: Conjunctivae are normal. PER. EOMI. Head: Atraumatic. Nose: No congestion/rhinnorhea. Mouth/Throat: Mucous membranes are moist.  Oropharynx non-erythematous. Neck: No stridor.  Cardiovascular: Normal rate, regular rhythm. Grossly normal heart sounds.  Good peripheral circulation. Respiratory: Normal respiratory effort.  No retractions. Lungs CTAB. Gastrointestinal: Soft and nontender. No distention. No abdominal bruits. No CVA tenderness. Musculoskeletal: No lower extremity tenderness nor edema.  No joint effusions. Neurologic:  Normal speech and language. No gross focal neurologic deficits are appreciated.  Skin:  Skin is warm, dry and intact. No rash noted. Psychiatric: Mood and affect are normal. Speech and behavior are normal.  ____________________________________________   LABS (all labs ordered are listed, but only abnormal results are displayed)  Labs Reviewed  CBC WITH DIFFERENTIAL/PLATELET - Abnormal; Notable for the following components:      Result Value   WBC 11.6 (*)    Neutro Abs 7.6 (*)    Monocytes Absolute 1.4 (*)    All other components within normal limits  COMPREHENSIVE METABOLIC PANEL - Abnormal; Notable for the following components:   Glucose, Bld 102 (*)    Total Protein 8.6 (*)    Albumin 5.1 (*)     All other components within normal limits   ____________________________________________  EKG   ____________________________________________  RADIOLOGY  ED MD interpretation:    Official radiology report(s): No results found.  ____________________________________________   PROCEDURES  Procedure(s) performed:   Procedures  Critical Care performed:   ____________________________________________   INITIAL IMPRESSION / ASSESSMENT AND PLAN / ED COURSE  patient is now doing much better. His speech is just about normal he never had any shortness of breath. I will let him go. We'll have him follow-up with an allergist to try to determine what is causing this.         ____________________________________________   FINAL CLINICAL IMPRESSION(S) / ED DIAGNOSES  Final diagnoses:  Angioedema, initial encounter     ED Discharge Orders        Ordered    EPINEPHrine (EPIPEN 2-PAK) 0.3 mg/0.3 mL IJ SOAJ injection   Once     10/17/17 1247    predniSONE (DELTASONE) 20 MG tablet  Daily     10/17/17 1247    ranitidine (ZANTAC) 300 MG tablet  Daily at bedtime     10/17/17 1247       Note:  This document was prepared using Dragon voice recognition software and may include unintentional dictation errors.    Arnaldo Natal, MD 10/17/17 1247

## 2017-11-12 ENCOUNTER — Other Ambulatory Visit: Payer: Self-pay | Admitting: Family Medicine

## 2017-11-13 ENCOUNTER — Other Ambulatory Visit: Payer: Self-pay | Admitting: Family Medicine

## 2017-12-13 ENCOUNTER — Other Ambulatory Visit: Payer: Self-pay | Admitting: Family Medicine

## 2017-12-28 ENCOUNTER — Other Ambulatory Visit: Payer: Self-pay | Admitting: Family Medicine

## 2018-01-03 ENCOUNTER — Other Ambulatory Visit: Payer: Self-pay | Admitting: Family Medicine

## 2018-01-14 ENCOUNTER — Other Ambulatory Visit: Payer: Self-pay | Admitting: Family Medicine

## 2018-01-15 ENCOUNTER — Other Ambulatory Visit: Payer: Self-pay | Admitting: Family Medicine

## 2018-01-19 ENCOUNTER — Telehealth: Payer: Self-pay

## 2018-01-19 NOTE — Telephone Encounter (Signed)
Patient called to report episodes of mid chest pain/pressure, left sided face numbness, headache, and tongue swelling the past couple weeks. He states the episodes last approximately 1 hour. At times he experiences blurred vision and slurred speech in the mornings but states symptoms gradually improved as the day goes by. Patient denies N/V. He was seen in the ER on 10/17/17 for tongue swelling. Discussed with Dr. Sherrie MustacheFisher. Per Dr. Sherrie MustacheFisher patient needs to be seen on 01/20/18 by a PA because he does not have any appointments available. Patient advised. Appointment scheduled with Nadine CountsBob on 01/20/18 and advised patient per Dr. Sherrie MustacheFisher if he develops any of the symptoms above again before appointment tomorrow to go to the ER for evaluation for stroke. Patient verbalized understanding.

## 2018-01-20 ENCOUNTER — Ambulatory Visit: Payer: Managed Care, Other (non HMO) | Admitting: Family Medicine

## 2018-01-20 ENCOUNTER — Encounter: Payer: Self-pay | Admitting: Family Medicine

## 2018-01-20 ENCOUNTER — Other Ambulatory Visit: Payer: Self-pay | Admitting: Family Medicine

## 2018-01-20 VITALS — BP 150/100 | HR 83 | Temp 98.1°F | Resp 16 | Wt 160.8 lb

## 2018-01-20 DIAGNOSIS — R079 Chest pain, unspecified: Secondary | ICD-10-CM | POA: Diagnosis not present

## 2018-01-20 DIAGNOSIS — K219 Gastro-esophageal reflux disease without esophagitis: Secondary | ICD-10-CM | POA: Diagnosis not present

## 2018-01-20 DIAGNOSIS — I10 Essential (primary) hypertension: Secondary | ICD-10-CM | POA: Diagnosis not present

## 2018-01-20 DIAGNOSIS — Z87898 Personal history of other specified conditions: Secondary | ICD-10-CM

## 2018-01-20 MED ORDER — SUCRALFATE 1 G PO TABS: 1.0000 g | ORAL_TABLET | Freq: Three times a day (TID) | ORAL | 0 refills | Status: DC

## 2018-01-20 MED ORDER — AMLODIPINE BESYLATE 5 MG PO TABS: 5.0000 mg | ORAL_TABLET | Freq: Every day | ORAL | 1 refills | Status: DC

## 2018-01-20 NOTE — Progress Notes (Signed)
  Subjective:     Patient ID: Derek Fields, male   DOB: 11/14/1960, 57 y.o.   MRN: [id number] Chief Complaint  Patient presents with  . Chest Pain    Patient comes in office today with concerns of chest pain that has been intermittent for the past 3 weeks, patient states in the past 3-4 days pain has been constant. Patient describes pain in chest as a heacy pressure feeling located in middle of chest. Patient states that he has noticed at times sharp pain in between his shoulder blades when this occurs. Patient reports one episode he had of numbness to his face and left arm that lasted a matter of seconds when chest pain occured.   . Oral Swelling    Patient has concerns of swelling on the side of his tongue for the past 3 weeks, patient denies this affecting him when eating or drinking.    HPI He has hx of chest pain, facial paresthesias, and angioedema intermittently since 2015. Describes it today as a "pressure". Had neuro and cardiology evaluations at that time without significant findings. 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 Denies unusual stress: "I just want to find out what is wrong." Has been out of amlodipine but reports compliance with other medication including doxazosin.  Review of Systems     Objective:   Physical Exam  Constitutional: He appears well-developed and well-nourished. No distress.  Neck: Carotid bruit is not present.  Cardiovascular: Normal rate and regular rhythm.  Pulmonary/Chest: Breath sounds normal.  Musculoskeletal: He exhibits no edema (of lower extremities).       Assessment:    1. Essential hypertension: resume medication - amLODipine (NORVASC) 5 MG tablet; Take 1 tablet (5 mg total) by mouth daily.  Dispense: 90 tablet; Refill: 1  2. Chest pain, unspecified type - EKG 12-Lead: NSR without acute change - sucralfate (CARAFATE) 1 g tablet; Take 1 tablet (1 g total) by mouth 4 (four) times daily -  with meals and at bedtime.   Dispense: 28 tablet; Refill: 0  3. Hx of angioedema - Ambulatory referral to Allergy  4. Gastroesophageal reflux disease without esophagitis: continue Nexium - sucralfate (CARAFATE) 1 g tablet; Take 1 tablet (1 g total) by mouth 4 (four) times daily -  with meals and at bedtime.  Dispense: 28 tablet; Refill: 0    Plan:    Try Nicotine products for smoking cessation.

## 2018-01-20 NOTE — Patient Instructions (Signed)
We will call you about the allergist referral. Try a nicotine product to stop/slow down smoking.

## 2018-02-02 ENCOUNTER — Ambulatory Visit: Payer: Self-pay | Admitting: Family Medicine

## 2018-02-02 VITALS — BP 161/88 | HR 81 | Resp 20 | Ht 65.0 in | Wt 166.0 lb

## 2018-02-02 DIAGNOSIS — Z008 Encounter for other general examination: Secondary | ICD-10-CM

## 2018-02-02 DIAGNOSIS — Z0189 Encounter for other specified special examinations: Principal | ICD-10-CM

## 2018-02-02 NOTE — Progress Notes (Signed)
Subjective: Annual biometrics screening  Patient presents for his annual biometric screening. Patient reports eating a fairly well-rounded diet but that there is room for improvement.  Patient is working on eating less fried foods.  Patient also plans to quit smoking in the near future.  Patient reports getting regular physical activity.  Patient regularly sees his primary care provider.  Patient reports a history of hypertension but that he stopped taking his medication and was just restarted on amlodipine this past Friday.  Patient regularly monitors his blood pressure.  Patient has an upcoming follow-up appointment with his primary care provider soon. 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 Patient denies any other issues or concerns.   Review of Systems Unremarkable  Objective  Physical Exam General: Awake, alert and oriented. No acute distress. Well developed, hydrated and nourished. Appears stated age.  HEENT: Supple neck without adenopathy. Sclera is non-icteric. The ear canal is clear without discharge. The tympanic membrane is normal in appearance with normal landmarks and cone of light. Nasal mucosa is pink and moist. Oral mucosa is pink and moist. The pharynx is normal in appearance without tonsillar swelling or exudates.  Skin: Skin in warm, dry and intact without rashes or lesions. Appropriate color for ethnicity. Cardiac: Heart rate and rhythm are normal. No murmurs, gallops, or rubs are auscultated.  Respiratory: The chest wall is symmetric and without deformity. No signs of respiratory distress. Lung sounds are clear in all lobes bilaterally without rales, ronchi, or wheezes.  Neurological: The patient is awake, alert and oriented to person, place, and time with normal speech.  Memory is normal and thought processes intact. No gait abnormalities are appreciated.  Psychiatric: Appropriate mood and affect.   Assessment Annual biometrics screening  Plan   Lipid panel and fasting blood sugar pending. Encouraged routine visits with primary care provider.  Patient's blood pressure is 161/88 today.  Discussed normal values.  Advised patient to monitor this regularly and report abnormal values to his primary care provider.  Stressed the importance of attending his follow-up appointment. Encouraged patient to get regular exercise, quit smoking, and eat a healthy, well-rounded diet.  Patient plans to discuss smoking cessation plans with his primary care provider at his upcoming visit.

## 2018-02-03 LAB — GLUCOSE, RANDOM: Glucose: 101 mg/dL — ABNORMAL HIGH (ref 65–99)

## 2018-02-03 LAB — LIPID PANEL
CHOL/HDL RATIO: 4.6 ratio (ref 0.0–5.0)
Cholesterol, Total: 306 mg/dL — ABNORMAL HIGH (ref 100–199)
HDL: 67 mg/dL (ref >39)
TRIGLYCERIDES: 463 mg/dL — AB (ref 0–149)

## 2018-02-03 NOTE — Progress Notes (Signed)
Called and spoke with the patient over the phone regarding his lipid panel and fasting blood sugar.  Discussed abnormal values, normal values, and reference ranges.  Advised patient to follow-up with his primary care provider within the next few days regarding his abnormal lab results, patient reports he already has a scheduled appointment with a new primary care provider at 9:30 AM on this upcoming Monday morning.  Printed off lab results and advised patient to swing by our clinic to take them with him to his first appointment so that he can discuss this with his primary care provider.

## 2018-02-07 DIAGNOSIS — J449 Chronic obstructive pulmonary disease, unspecified: Secondary | ICD-10-CM | POA: Insufficient documentation

## 2018-02-07 DIAGNOSIS — R3912 Poor urinary stream: Secondary | ICD-10-CM

## 2018-02-07 DIAGNOSIS — N401 Enlarged prostate with lower urinary tract symptoms: Secondary | ICD-10-CM | POA: Insufficient documentation

## 2018-02-07 DIAGNOSIS — J4489 Other specified chronic obstructive pulmonary disease: Secondary | ICD-10-CM | POA: Insufficient documentation

## 2018-02-11 ENCOUNTER — Other Ambulatory Visit: Payer: Self-pay | Admitting: Family Medicine

## 2018-02-11 ENCOUNTER — Ambulatory Visit: Payer: Self-pay | Admitting: Family Medicine

## 2018-03-10 ENCOUNTER — Other Ambulatory Visit: Payer: Self-pay | Admitting: Family Medicine

## 2018-03-18 ENCOUNTER — Ambulatory Visit: Payer: Self-pay | Admitting: Emergency Medicine

## 2018-03-18 VITALS — BP 138/78 | HR 75 | Temp 98.2°F | Resp 16

## 2018-03-18 DIAGNOSIS — H1031 Unspecified acute conjunctivitis, right eye: Secondary | ICD-10-CM

## 2018-03-18 MED ORDER — GENTAMICIN SULFATE 0.3 % OP SOLN: 2.0000 [drp] | OPHTHALMIC | 0 refills | Status: DC

## 2018-03-18 NOTE — Patient Instructions (Addendum)
You have "pinkeye" right eye.  Likely caused by bacterial infection. Today in the clinic, we did a special test to look for a corneal abrasion/scratch, and there is no sign of corneal abrasion/scratch. I put erythromycin ointment in right eye and patched right eye. Keep the patch on until tonight.-Then start the gentamicin eyedrops as prescribed. Do not use contact lens right eye until the pinkeye is completely better. Excused from work today. Follow-up with your eye doctor if no better in 3 days, or go to emergency room sooner if any worsening or severe symptoms.  Bacterial Conjunctivitis Bacterial conjunctivitis is an infection of the clear membrane that covers the white part of your eye and the inner surface of your eyelid (conjunctiva). When the blood vessels in your conjunctiva become inflamed, your eye becomes red or pink, and it will probably feel itchy. Bacterial conjunctivitis spreads very easily from person to person (is contagious). It also spreads easily from one eye to the other eye. What are the causes? This condition is caused by several common bacteria. You may get the infection if you come into close contact with another person who is infected. You may also come into contact with items that are contaminated with the bacteria, such as a face towel, contact lens solution, or eye makeup. What increases the risk? This condition is more likely to develop in people who:  Are exposed to other people who have the infection.  Wear contact lenses.  Have a sinus infection.  Have had a recent eye injury or surgery.  Have a weak body defense system (immune system).  Have a medical condition that causes dry eyes.  What are the signs or symptoms? Symptoms of this condition include:  Eye redness.  Tearing or watery eyes.  Itchy eyes.  Burning feeling in your eyes.  Thick, yellowish discharge from an eye. This may turn into a crust on the eyelid overnight and cause your eyelids  to stick together.  Swollen eyelids.  Blurred vision.  How is this diagnosed? Your health care provider can diagnose this condition based on your symptoms and medical history. Your health care provider may also take a sample of discharge from your eye to find the cause of your infection. This is rarely done. How is this treated? Treatment for this condition includes:  Antibiotic eye drops or ointment to clear the infection more quickly and prevent the spread of infection to others.  Oral antibiotic medicines to treat infections that do not respond to drops or ointments, or last longer than 10 days.  Cool, wet cloths (cool compresses) placed on the eyes.  Artificial tears applied 2-6 times a day.  Follow these instructions at home: Medicines  Take or apply your antibiotic medicine as told by your health care provider. Do not stop taking or applying the antibiotic even if you start to feel better.  Take or apply over-the-counter and prescription medicines only as told by your health care provider.  Be very careful to avoid touching the edge of your eyelid with the eye drop bottle or the ointment tube when you apply medicines to the affected eye. This will keep you from spreading the infection to your other eye or to other people. Managing discomfort  Gently wipe away any drainage from your eye with a warm, wet washcloth or a cotton ball.  Apply a cool, clean washcloth to your eye for 10-20 minutes, 3-4 times a day. General instructions  Do not wear contact lenses until the inflammation is  gone and your health care provider says it is safe to wear them again. Ask your health care provider how to sterilize or replace your contact lenses before you use them again. Wear glasses until you can resume wearing contacts.  Avoid wearing eye makeup until the inflammation is gone. Throw away any old eye cosmetics that may be contaminated.  Change or wash your pillowcase every day.  Do not  share towels or washcloths. This may spread the infection.  Wash your hands often with soap and water. Use paper towels to dry your hands.  Avoid touching or rubbing your eyes.  Do not drive or use heavy machinery if your vision is blurred. Contact a health care provider if:  You have a fever.  Your symptoms do not get better after 10 days. Get help right away if:  You have a fever and your symptoms suddenly get worse.  You have severe pain when you move your eye.  You have facial pain, redness, or swelling.  You have sudden loss of vision. This information is not intended to replace advice given to you by your health care provider. Make sure you discuss any questions you have with your health care provider. Document Released: 07/27/2005 Document Revised: 12/05/2015 Document Reviewed: 05/09/2015 Elsevier Interactive Patient Education  2017 [employer].

## 2018-03-18 NOTE — Progress Notes (Addendum)
Subjective:    Patient ID: Derek Fields, male    DOB: [birth year], 57 y.o.   MRN: [id number]  Eye Problem   The right eye is affected. This is a new problem. The current episode started yesterday. He wears contacts. Associated symptoms include blurred vision, an eye discharge, eye redness, a foreign body sensation (Mild) and photophobia. Pertinent negatives include no double vision, fever, itching, nausea, recent URI or vomiting. Treatments tried: OTC eyedrops. The treatment provided no relief.   He removed right contact lens yesterday, tried OTC eyedrops, but that did not help significantly. He does not recall getting any foreign body in right eye.   Review of Systems  Constitutional: Negative for fever.  Eyes: Positive for blurred vision, photophobia, discharge and redness. Negative for double vision and itching.  Gastrointestinal: Negative for nausea and vomiting.  All other systems reviewed and are negative.  Past medical history: Hypertension, GERD, controlled on meds. He sees eye doctor regularly for annual exams and contact lenses.  He notes that his left eye "is not as good as my right eye"    Objective:   Physical Exam  Constitutional: He is oriented to person, place, and time. He appears well-developed and well-nourished. No distress.  HENT:  Head: Normocephalic and atraumatic.  Eyes: Pupils are equal, round, and reactive to light. EOM and lids are normal. Lids are everted and swept, no foreign bodies found. Right conjunctiva is injected. Left conjunctiva is not injected. No scleral icterus.  Slit lamp exam:      The right eye shows no corneal abrasion, no corneal flare, no corneal ulcer, no foreign body, no hyphema and no fluorescein uptake.  Right eye:  Conjunctiva right eye very red inflamed diffusely. One drop tetracaine instilled right eye, which provided excellent anesthesia. Lid everted, no foreign body seen. Fluorescein stain, no uptake, no sign of corneal  abrasion.   Neck: Normal range of motion. Neck supple.  Cardiovascular: Normal rate and regular rhythm.  Pulmonary/Chest: Effort normal.  Abdominal: He exhibits no distension.  Neurological: He is alert and oriented to person, place, and time. No cranial nerve deficit.  Skin: Skin is warm and dry.  Psychiatric: He has a normal mood and affect. His behavior is normal.  Vitals reviewed.  Near vision: Left eye with contact lens: 20/40 Right eye with contact lens 20/50       Assessment & Plan:   Conjunctivitis right eye. Risk benefits alternatives discussed, and treatment options discussed and he agreed to the following plans: Prescriptions for erythromycin ophthalmic ointment and gentamicin ophthalmic eyedrops.  He got these 2 prescriptions filled immediately in the pharmacy in our building, and then I applied erythromycin ointment to conjunctiva right eye.  I applied double eye patch right eye and he tolerated that well. An After Visit Summary was printed and given to the patient. Reviewed the AVS carefully and questions invited and answered.  Note written to excuse from work today. - AVS: You have "pinkeye" right eye.  Likely caused by bacterial infection. Today in the clinic, we did a special test to look for a corneal abrasion/scratch, and there is no sign of corneal abrasion/scratch. I put erythromycin ointment in right eye and patched right eye. Keep the patch on until tonight.-Then start the gentamicin eyedrops as prescribed. Do not use contact lens right eye until the pinkeye is completely better. Excused from work today. Follow-up with your eye doctor if no better in 3 days, or go to emergency room sooner  if any worsening or severe symptoms.

## 2018-03-21 ENCOUNTER — Encounter: Payer: Self-pay | Admitting: Urology

## 2018-03-21 ENCOUNTER — Ambulatory Visit (INDEPENDENT_AMBULATORY_CARE_PROVIDER_SITE_OTHER): Payer: Managed Care, Other (non HMO) | Admitting: Urology

## 2018-03-21 VITALS — BP 133/78 | HR 74 | Ht 65.0 in | Wt 162.7 lb

## 2018-03-21 DIAGNOSIS — K5900 Constipation, unspecified: Secondary | ICD-10-CM

## 2018-03-21 DIAGNOSIS — N401 Enlarged prostate with lower urinary tract symptoms: Secondary | ICD-10-CM | POA: Diagnosis not present

## 2018-03-21 LAB — URINALYSIS, COMPLETE
Bilirubin, UA: NEGATIVE
Glucose, UA: NEGATIVE
LEUKOCYTES UA: NEGATIVE
NITRITE UA: NEGATIVE
PH UA: 7.5 (ref 5.0–7.5)
SPEC GRAV UA: 1.02 (ref 1.005–1.030)
Urobilinogen, Ur: 1 mg/dL (ref 0.2–1.0)

## 2018-03-21 LAB — MICROSCOPIC EXAMINATION: EPITHELIAL CELLS (NON RENAL): NONE SEEN /HPF (ref 0–10)

## 2018-03-21 MED ORDER — POLYETHYLENE GLYCOL 3350 17 G PO PACK: 17.0000 g | PACK | Freq: Every day | ORAL | 11 refills | Status: DC

## 2018-03-21 MED ORDER — SENNOSIDES-DOCUSATE SODIUM 8.6-50 MG PO TABS: 1.0000 | ORAL_TABLET | Freq: Every day | ORAL | 11 refills | Status: DC

## 2018-03-21 NOTE — Progress Notes (Signed)
03/21/2018 2:10 PM   Kaylyn LayerRuddy F Fields 10/19/1960 [id number]  Referring provider: Barbette ReichmannHande, Vishwanath, MD [address] [facility] [place], [place] [address]  CC: Lower urinary tract symptoms  HPI: I am seeing [name] in consultation from Dr. Marcello FennelHande today for severe lower urinary tract symptoms.  Briefly, he is a healthy 57 year old male with a long multi-year history of weak stream, urgency, frequency, feeling of incomplete emptying, and nocturia.  His history is notable for reported bladder neck endoscopic procedure in the early 90s for unclear indications.  He is been on medical management with tamsulosin, doxazosin and finasteride, with minimal improvement in his symptoms.  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  He denies gross hematuria, flank pain, fevers, chills, urinary tract infections.  He also reports severe constipation.  PVR in clinic today is 34cc.  He denies family history of prostate cancer. [place] in [employer] [place]  IPSS score 26 (severe)  PMH: Past Medical History:  Diagnosis Date  . Arthritis   . GERD (gastroesophageal reflux disease)   . Hyperlipidemia   . Hypertension     Surgical History: Past Surgical History:  Procedure Laterality Date  . BLADDER SURGERY  1992   "trimmed up" bladder outlet  . NECK SURGERY  2010  . SHOULDER SURGERY  2013   right shoulder   Allergies: No Known Allergies  Family History: Family History  Problem Relation Age of Onset  . Heart disease Mother   . Cancer Father        Brain cancer.  . Prostate cancer Neg Hx   . Bladder Cancer Neg Hx   . Kidney cancer Neg Hx     Social History:  reports that he has been smoking cigarettes. He has a 20.00 pack-year smoking history. He has never used smokeless tobacco. He reports that he drinks about 4.0 standard drinks of alcohol per week. He reports that he does not use drugs.  ROS: Please see flowsheet from today's date for complete review of  systems.  Physical Exam: There were no vitals taken for this visit.  Constitutional:  Alert and oriented, No acute distress. Cardiovascular: No clubbing, cyanosis, or edema. Respiratory: Normal respiratory effort, no increased work of breathing. GI: Abdomen is soft, nontender, nondistended, no abdominal masses GU: No CVA tenderness, circumcised phallus without lesions, widely patent meatus DRE: ~50g gland, smooth, no nodules or masses Lymph: No cervical or inguinal lymphadenopathy. Skin: No rashes, bruises or suspicious lesions. Neurologic: Grossly intact, no focal deficits, moving all 4 extremities. Psychiatric: Normal mood and affect.  Laboratory Data: Lab Results  Component Value Date   WBC 11.6 (H) 10/17/2017   HGB 15.8 10/17/2017   HCT 46.7 10/17/2017   MCV 97.7 10/17/2017   PLT 301 10/17/2017    Lab Results  Component Value Date   CREATININE 0.99 10/17/2017   PSA 1.2 (09/2016)  Urinalysis benign today, no RBCs or WBCs, nitrite negative  Pertinent Imaging: None to review  Assessment & Plan:   In summary, [name] is a healthy 57 year old male with an extensive history of lower urinary tract symptoms, poorly managed on maximal medical therapy. IPSS score 26. We discussed at length possible etiologies of his urinary symptoms including stricture or bladder neck contracture from his procedure in the 90s, versus prostatic enlargement. Last PSA was 1.2 in 09/2016.  We also briefly discussed outlet procedures including transurethral resection of the prostate as well as well as HOLEP.  1. Benign prostatic hyperplasia with  lower urinary tract symptoms, symptom details unspecified -Follow up for cystoscopy and transrectal ultrasound of the prostate same day -Repeat PSA  -Discuss outlet procedures pending above findings   Derek ComeBrian C Rilley Stash, MD  [facility] [address], [address] [place], [place] [address] [phone]

## 2018-03-23 ENCOUNTER — Other Ambulatory Visit: Payer: Self-pay

## 2018-03-23 ENCOUNTER — Encounter: Payer: Self-pay | Admitting: Radiology

## 2018-03-23 ENCOUNTER — Encounter: Payer: Self-pay | Admitting: Urology

## 2018-03-23 ENCOUNTER — Ambulatory Visit (INDEPENDENT_AMBULATORY_CARE_PROVIDER_SITE_OTHER): Payer: Managed Care, Other (non HMO) | Admitting: Urology

## 2018-03-23 VITALS — BP 141/87 | HR 83 | Ht 65.0 in | Wt 156.8 lb

## 2018-03-23 DIAGNOSIS — N401 Enlarged prostate with lower urinary tract symptoms: Secondary | ICD-10-CM | POA: Diagnosis not present

## 2018-03-23 LAB — MICROSCOPIC EXAMINATION
Epithelial Cells (non renal): NONE SEEN /hpf (ref 0–10)
WBC, UA: NONE SEEN /hpf (ref 0–5)

## 2018-03-23 LAB — URINALYSIS, COMPLETE
BILIRUBIN UA: NEGATIVE
GLUCOSE, UA: NEGATIVE
KETONES UA: NEGATIVE
LEUKOCYTES UA: NEGATIVE
Nitrite, UA: NEGATIVE
RBC, UA: NEGATIVE
SPEC GRAV UA: 1.015 (ref 1.005–1.030)
Urobilinogen, Ur: 1 mg/dL (ref 0.2–1.0)
pH, UA: 8.5 — ABNORMAL HIGH (ref 5.0–7.5)

## 2018-03-23 MED ORDER — CIPROFLOXACIN HCL 500 MG PO TABS
500.0000 mg | ORAL_TABLET | Freq: Once | ORAL | Status: AC
  Administered 2018-03-23: 500 mg via ORAL

## 2018-03-23 MED ORDER — LIDOCAINE HCL URETHRAL/MUCOSAL 2 % EX GEL
1.0000 | Freq: Once | CUTANEOUS | Status: AC
Start: 2018-03-23 — End: 2018-03-23
  Administered 2018-03-23: 1 via URETHRAL

## 2018-03-23 NOTE — Patient Instructions (Signed)
Transurethral Resection of the Prostate, Care After °Refer to this sheet in the next few weeks. These instructions provide you with information about caring for yourself after your procedure. Your health care provider may also give you more specific instructions. Your treatment has been planned according to current medical practices, but problems sometimes occur. Call your health care provider if you have any problems or questions after your procedure. °What can I expect after the procedure? °After the procedure, it is common to have: °· Mild pain in your lower abdomen. °· Soreness or mild discomfort in your penis from having the catheter inserted during the procedure. °· A feeling of urgency when you need to urinate. °· A small amount of blood in your urine. You may notice some small blood clots in your urine. These are normal. ° °Follow these instructions at home: °Medicines ° °· Take over-the-counter and prescription medicines only as told by your health care provider. °· Do not drive or operate heavy machinery while taking prescription pain medicine. °· Do not drive for 24 hours if you received a sedative. °· If you were prescribed antibiotic medicine, take it as told by your health care provider. Do not stop taking the antibiotic even if you start to feel better. °Activity °· Return to your normal activities as told by your health care provider. Ask your health care provider what activities are safe for you. °· Do not lift anything that is heavier than 10 lb (4.5 kg) for 3 weeks after your procedure, or as long as told by your health care provider. °· Avoid intense physical activity for as long as told by your health care provider. °· Walk at least one time every day. This helps to prevent blood clots. You may increase your physical activity gradually as you start to feel better. °Lifestyle °· Do not drink alcohol for as long as told by your health care provider. This is especially important if you are taking  prescription pain medicines. °· Do not engage in sexual activity until your health care provider says that you can do this. °General instructions °· Do not take baths, swim, or use a hot tub until your health care provider approves. °· Drink enough fluid to keep your urine clear or pale yellow. °· Urinate as soon as you feel the need to. Do not try to hold your urine for long periods of time. °· If your health care provider approves, you may take a stool softener for 2-3 weeks to prevent you from straining to have a bowel movement. °· Wear compression stockings as told by your health care provider. These stockings help to prevent blood clots and reduce swelling in your legs. °· Keep all follow-up visits as told by your health care provider. This is important. °Contact a health care provider if: °· You have difficulty urinating. °· You have a fever. °· You have pain that gets worse or does not improve with medicine. °· You have blood in your urine that does not go away after 1 week of resting and drinking more fluids. °· You have swelling in your penis or testicles. °Get help right away if: °· You are unable to urinate. °· You are having more blood clots in your urine instead of fewer. °· You have: °? Large blood clots. °? A lot of blood in your urine. °? Pain in your back or lower abdomen. °? Pain or swelling in your legs. °? Chills and you are shaking. °This information is not intended to   replace advice given to you by your health care provider. Make sure you discuss any questions you have with your health care provider. °Document Released: 07/27/2005 Document Revised: 03/29/2016 Document Reviewed: 04/18/2015 °Elsevier Interactive Patient Education © 2017 [employer] ° °

## 2018-03-23 NOTE — Progress Notes (Signed)
Cystoscopy Procedure Note:  Indication: BPH symptoms  After informed consent and discussion of the procedure and its risks, Alexandru F Rosato was positioned and prepped in the standard fashion. Cystoscopy was performed with the a flexible cystoscope. The urethra, bladder neck and entire bladder was visualized in a standard fashion.  The urethra was grossly normal.  The prostate was short with a high bladder neck, and significant median lobe intravesical protrusion.  No stones or mucosal lesions seen.  Transrectal ultrasound procedure note   The transrectal ultrasound probe was inserted into the patient's rectum.  Measurements were taken which demonstrated a 36 cc gland.  There was a significant median lobe with intravesical protrusion.  There were no notable hypoechoic areas in the prostate.  Findings: 1) Cystoscopy with short prostate and large median lobe, high bladder neck  2) Transrectal ultrasound with 36 cc gland  Assessment and Plan: We discussed the above findings at length.  He would like to pursue surgical options for his severe lower urinary tract symptoms and BPH.  I discussed options including UroLift, TURP, and HOLEP.  Based on his small gland with a high bladder neck and significant median lobe I strongly recommended bipolar TURP.  We discussed the risks and benefits including bleeding, infection, temporary urge incontinence, and retrograde ejaculation.  Schedule bipolar TURP  Sondra ComeBrian C Gean Laursen 03/23/2018

## 2018-03-24 ENCOUNTER — Other Ambulatory Visit: Payer: Self-pay | Admitting: Radiology

## 2018-03-24 DIAGNOSIS — N401 Enlarged prostate with lower urinary tract symptoms: Secondary | ICD-10-CM

## 2018-03-27 LAB — URINE CULTURE

## 2018-03-28 ENCOUNTER — Telehealth: Payer: Self-pay | Admitting: Radiology

## 2018-03-28 ENCOUNTER — Other Ambulatory Visit: Payer: Self-pay | Admitting: Radiology

## 2018-03-28 DIAGNOSIS — N390 Urinary tract infection, site not specified: Secondary | ICD-10-CM

## 2018-03-28 DIAGNOSIS — N401 Enlarged prostate with lower urinary tract symptoms: Secondary | ICD-10-CM

## 2018-03-28 MED ORDER — AMOXICILLIN-POT CLAVULANATE 875-125 MG PO TABS: 1.0000 | ORAL_TABLET | Freq: Two times a day (BID) | ORAL | 0 refills | Status: AC

## 2018-03-28 NOTE — Telephone Encounter (Signed)
Notified patient of positive urine culture & script sent to pharmacy. Patient voices understanding. 

## 2018-03-28 NOTE — Telephone Encounter (Signed)
-----   Message from Sondra ComeBrian C Sninsky, MD sent at 03/28/2018  9:08 AM EDT ----- Regarding: abx pre-op Please start him on Augmentin 875 BID starting 5 days prior to surgery for streptococcus in urine cx.   Thanks Sondra ComeBrian C Sninsky, MD 03/28/2018

## 2018-03-31 ENCOUNTER — Encounter
Admission: RE | Admit: 2018-03-31 | Discharge: 2018-03-31 | Disposition: A | Discharge status: Home / Self Care | Payer: Managed Care, Other (non HMO) | Source: Ambulatory Visit | Attending: Urology | Admitting: Urology

## 2018-03-31 ENCOUNTER — Other Ambulatory Visit: Payer: Self-pay

## 2018-03-31 DIAGNOSIS — Z01812 Encounter for preprocedural laboratory examination: Secondary | ICD-10-CM | POA: Insufficient documentation

## 2018-03-31 LAB — CBC
HEMATOCRIT: 43.8 % (ref 40.0–52.0)
Hemoglobin: 15.6 g/dL (ref 13.0–18.0)
MCH: 34.4 pg — ABNORMAL HIGH (ref 26.0–34.0)
MCHC: 35.6 g/dL (ref 32.0–36.0)
MCV: 96.6 fL (ref 80.0–100.0)
Platelets: 265 10*3/uL (ref 150–440)
RBC: 4.53 MIL/uL (ref 4.40–5.90)
RDW: 13.1 % (ref 11.5–14.5)
WBC: 8.8 10*3/uL (ref 3.8–10.6)

## 2018-03-31 NOTE — Patient Instructions (Signed)
Your procedure is scheduled on: Friday 04/08/18  Report to DAY SURGERY DEPARTMENT LOCATED ON 2ND FLOOR MEDICAL MALL ENTRANCE. To find out your arrival time please call [phone] between 1PM - 3PM on Thursday 04/07/18.  Remember: Instructions that are not followed completely may result in serious medical risk, up to and including death, or upon the discretion of your surgeon and anesthesiologist your surgery may need to be rescheduled.     _X__ 1. Do not eat food after midnight the night before your procedure.                 No gum chewing or hard candies. You may drink clear liquids up to 2 hours                 before you are scheduled to arrive for your surgery- DO not drink clear                 liquids within 2 hours of the start of your surgery.                 Clear Liquids include:  water, apple juice without pulp, clear carbohydrate                 drink such as Clearfast or Gatorade, Black Coffee or Tea (Do not add                 anything to coffee or tea).  __X__2.  On the morning of surgery brush your teeth with toothpaste and water, you                 may rinse your mouth with mouthwash if you wish.  Do not swallow any              toothpaste of mouthwash.     _X__ 3.  No Alcohol for 24 hours before or after surgery.   _X__ 4.  Do Not Smoke or use e-cigarettes For 24 Hours Prior to Your Surgery.                 Do not use any chewable tobacco products for at least 6 hours prior to                 surgery.  ____  5.  Bring all medications with you on the day of surgery if instructed.   __X__  6.  Notify your doctor if there is any change in your medical condition      (cold, fever, infections).     Do not wear jewelry, make-up, hairpins, clips or nail polish. Do not wear lotions, powders, or perfumes.  Do not shave 48 hours prior to surgery. Men may shave face and neck. Do not bring valuables to the hospital.    [facility] is not responsible for any belongings or  valuables.  Contacts, dentures/partials or body piercings may not be worn into surgery. Bring a case for your contacts, glasses or hearing aids, a denture cup will be supplied. Leave your suitcase in the car. After surgery it may be brought to your room. For patients admitted to the hospital, discharge time is determined by your treatment team.   Patients discharged the day of surgery will not be allowed to drive home.   Please read over the following fact sheets that you were given:   MRSA Information  __X__ Take these medicines the morning of surgery with A SIP OF WATER:  1. amLODipine (NORVASC  2. esomeprazole (NEXIUM  3. finasteride (PROSCAR  4. pravastatin (PRAVACHOL  5.  6.  ____ Fleet Enema (as directed)   __X__ Use CHG Soap/SAGE wipes as directed  ____ Use inhalers on the day of surgery  ____ Stop metformin/Janumet/Farxiga 2 days prior to surgery    ____ Take 1/2 of usual insulin dose the night before surgery. No insulin the morning          of surgery.   ____ Stop Blood Thinners Coumadin/Plavix/Xarelto/Pleta/Pradaxa/Eliquis/Effient/Aspirin  on   Or contact your Surgeon, Cardiologist or Medical Doctor regarding  ability to stop your blood thinners  __X__ Stop Anti-inflammatories 7 days before surgery such as Advil, Ibuprofen, Motrin,  BC or Goodies Powder, Naprosyn, Naproxen, Aleve, Aspirin    __X__ Stop all herbal supplements, fish oil or vitamin E until after surgery.    ____ Bring C-Pap to the hospital.

## 2018-04-07 MED ORDER — CIPROFLOXACIN IN D5W 400 MG/200ML IV SOLN
400.0000 mg | INTRAVENOUS | Status: AC
  Administered 2018-04-08: 400 mg via INTRAVENOUS

## 2018-04-08 ENCOUNTER — Observation Stay
Admission: RE | Admit: 2018-04-08 | Discharge: 2018-04-09 | Disposition: A | Discharge status: Home / Self Care | Payer: Managed Care, Other (non HMO) | Source: Ambulatory Visit | Attending: Urology | Admitting: Urology

## 2018-04-08 ENCOUNTER — Other Ambulatory Visit: Payer: Self-pay

## 2018-04-08 ENCOUNTER — Encounter
Admission: RE | Disposition: A | Discharge status: Home / Self Care | Payer: Self-pay | Source: Ambulatory Visit | Attending: Urology

## 2018-04-08 ENCOUNTER — Encounter: Payer: Self-pay | Admitting: *Deleted

## 2018-04-08 ENCOUNTER — Ambulatory Visit: Payer: Managed Care, Other (non HMO) | Admitting: Anesthesiology

## 2018-04-08 DIAGNOSIS — R338 Other retention of urine: Secondary | ICD-10-CM | POA: Diagnosis not present

## 2018-04-08 DIAGNOSIS — Z7982 Long term (current) use of aspirin: Secondary | ICD-10-CM | POA: Diagnosis not present

## 2018-04-08 DIAGNOSIS — R351 Nocturia: Secondary | ICD-10-CM | POA: Diagnosis not present

## 2018-04-08 DIAGNOSIS — R350 Frequency of micturition: Secondary | ICD-10-CM | POA: Diagnosis not present

## 2018-04-08 DIAGNOSIS — N411 Chronic prostatitis: Secondary | ICD-10-CM | POA: Diagnosis not present

## 2018-04-08 DIAGNOSIS — N3289 Other specified disorders of bladder: Secondary | ICD-10-CM | POA: Diagnosis not present

## 2018-04-08 DIAGNOSIS — Z79899 Other long term (current) drug therapy: Secondary | ICD-10-CM | POA: Diagnosis not present

## 2018-04-08 DIAGNOSIS — R3915 Urgency of urination: Secondary | ICD-10-CM | POA: Diagnosis not present

## 2018-04-08 DIAGNOSIS — K219 Gastro-esophageal reflux disease without esophagitis: Secondary | ICD-10-CM | POA: Insufficient documentation

## 2018-04-08 DIAGNOSIS — E785 Hyperlipidemia, unspecified: Secondary | ICD-10-CM | POA: Diagnosis not present

## 2018-04-08 DIAGNOSIS — N410 Acute prostatitis: Secondary | ICD-10-CM | POA: Diagnosis not present

## 2018-04-08 DIAGNOSIS — M1990 Unspecified osteoarthritis, unspecified site: Secondary | ICD-10-CM | POA: Insufficient documentation

## 2018-04-08 DIAGNOSIS — R3912 Poor urinary stream: Secondary | ICD-10-CM | POA: Diagnosis not present

## 2018-04-08 DIAGNOSIS — N401 Enlarged prostate with lower urinary tract symptoms: Secondary | ICD-10-CM | POA: Diagnosis not present

## 2018-04-08 DIAGNOSIS — N400 Enlarged prostate without lower urinary tract symptoms: Secondary | ICD-10-CM | POA: Diagnosis present

## 2018-04-08 DIAGNOSIS — I10 Essential (primary) hypertension: Secondary | ICD-10-CM | POA: Diagnosis not present

## 2018-04-08 DIAGNOSIS — F17210 Nicotine dependence, cigarettes, uncomplicated: Secondary | ICD-10-CM | POA: Diagnosis not present

## 2018-04-08 DIAGNOSIS — N138 Other obstructive and reflux uropathy: Secondary | ICD-10-CM

## 2018-04-08 HISTORY — PX: TRANSURETHRAL RESECTION OF PROSTATE: SHX73

## 2018-04-08 SURGERY — TURP (TRANSURETHRAL RESECTION OF PROSTATE)
Anesthesia: General | Site: Prostate | Wound class: "Clean Contaminated "

## 2018-04-08 MED ORDER — DEXAMETHASONE SODIUM PHOSPHATE 10 MG/ML IJ SOLN
INTRAMUSCULAR | Status: DC | PRN
  Administered 2018-04-08: 10 mg via INTRAVENOUS

## 2018-04-08 MED ORDER — SUCCINYLCHOLINE CHLORIDE 20 MG/ML IJ SOLN
INTRAMUSCULAR | Status: AC
  Filled 2018-04-08: qty 1

## 2018-04-08 MED ORDER — LACTATED RINGERS IV SOLN
INTRAVENOUS | Status: DC
  Administered 2018-04-08: 75 mL/h via INTRAVENOUS

## 2018-04-08 MED ORDER — HYDROCODONE-ACETAMINOPHEN 5-325 MG PO TABS
1.0000 | ORAL_TABLET | ORAL | Status: DC | PRN
  Administered 2018-04-08 – 2018-04-09 (×5): 2 via ORAL
  Filled 2018-04-08 (×5): qty 2

## 2018-04-08 MED ORDER — OXYCODONE HCL 5 MG/5ML PO SOLN: 5.0000 mg | Freq: Once | ORAL | Status: AC | PRN

## 2018-04-08 MED ORDER — FENTANYL CITRATE (PF) 100 MCG/2ML IJ SOLN
INTRAMUSCULAR | Status: DC | PRN
  Administered 2018-04-08 (×3): 50 ug via INTRAVENOUS

## 2018-04-08 MED ORDER — LIDOCAINE HCL (CARDIAC) PF 100 MG/5ML IV SOSY
PREFILLED_SYRINGE | INTRAVENOUS | Status: DC | PRN
  Administered 2018-04-08: 100 mg via INTRAVENOUS

## 2018-04-08 MED ORDER — PROPOFOL 10 MG/ML IV BOLUS
INTRAVENOUS | Status: DC | PRN
  Administered 2018-04-08: 180 mg via INTRAVENOUS

## 2018-04-08 MED ORDER — EPHEDRINE SULFATE 50 MG/ML IJ SOLN
INTRAMUSCULAR | Status: AC
  Filled 2018-04-08: qty 1

## 2018-04-08 MED ORDER — ONDANSETRON HCL 4 MG/2ML IJ SOLN
INTRAMUSCULAR | Status: AC
  Filled 2018-04-08: qty 2

## 2018-04-08 MED ORDER — PROPOFOL 10 MG/ML IV BOLUS
INTRAVENOUS | Status: AC
  Filled 2018-04-08: qty 20

## 2018-04-08 MED ORDER — DOXAZOSIN MESYLATE 2 MG PO TABS
2.0000 mg | ORAL_TABLET | Freq: Every evening | ORAL | Status: DC
  Filled 2018-04-08 (×2): qty 1

## 2018-04-08 MED ORDER — OXYCODONE HCL 5 MG PO TABS
ORAL_TABLET | ORAL | Status: AC
  Filled 2018-04-08: qty 1

## 2018-04-08 MED ORDER — PRAVASTATIN SODIUM 20 MG PO TABS
80.0000 mg | ORAL_TABLET | Freq: Every day | ORAL | Status: DC
  Administered 2018-04-09: 80 mg via ORAL
  Filled 2018-04-08: qty 4

## 2018-04-08 MED ORDER — ACETAMINOPHEN 325 MG PO TABS: 650.0000 mg | ORAL_TABLET | ORAL | Status: DC | PRN

## 2018-04-08 MED ORDER — ROCURONIUM BROMIDE 50 MG/5ML IV SOLN
INTRAVENOUS | Status: AC
  Filled 2018-04-08: qty 1

## 2018-04-08 MED ORDER — SODIUM CHLORIDE 0.9 % IR SOLN
3000.0000 mL | Status: DC
  Administered 2018-04-08: 3000 mL

## 2018-04-08 MED ORDER — GLYCOPYRROLATE 0.2 MG/ML IJ SOLN
INTRAMUSCULAR | Status: AC
  Filled 2018-04-08: qty 1

## 2018-04-08 MED ORDER — CIPROFLOXACIN IN D5W 400 MG/200ML IV SOLN
INTRAVENOUS | Status: AC
  Filled 2018-04-08: qty 200

## 2018-04-08 MED ORDER — FENTANYL CITRATE (PF) 100 MCG/2ML IJ SOLN
INTRAMUSCULAR | Status: AC
  Administered 2018-04-08: 50 ug via INTRAVENOUS
  Filled 2018-04-08: qty 2

## 2018-04-08 MED ORDER — AMLODIPINE BESYLATE 10 MG PO TABS
10.0000 mg | ORAL_TABLET | Freq: Every day | ORAL | Status: DC
  Administered 2018-04-09: 10 mg via ORAL
  Filled 2018-04-08: qty 1

## 2018-04-08 MED ORDER — ROCURONIUM BROMIDE 100 MG/10ML IV SOLN
INTRAVENOUS | Status: DC | PRN
  Administered 2018-04-08: 40 mg via INTRAVENOUS

## 2018-04-08 MED ORDER — PANTOPRAZOLE SODIUM 40 MG PO TBEC
40.0000 mg | DELAYED_RELEASE_TABLET | Freq: Every day | ORAL | Status: DC
  Administered 2018-04-09: 40 mg via ORAL
  Filled 2018-04-08: qty 1

## 2018-04-08 MED ORDER — FENTANYL CITRATE (PF) 100 MCG/2ML IJ SOLN
INTRAMUSCULAR | Status: AC
  Filled 2018-04-08: qty 2

## 2018-04-08 MED ORDER — MIDAZOLAM HCL 2 MG/2ML IJ SOLN
INTRAMUSCULAR | Status: AC
  Filled 2018-04-08: qty 2

## 2018-04-08 MED ORDER — SUGAMMADEX SODIUM 200 MG/2ML IV SOLN
INTRAVENOUS | Status: DC | PRN
  Administered 2018-04-08: 150 mg via INTRAVENOUS

## 2018-04-08 MED ORDER — FENTANYL CITRATE (PF) 100 MCG/2ML IJ SOLN
25.0000 ug | INTRAMUSCULAR | Status: DC | PRN
  Administered 2018-04-08 (×3): 50 ug via INTRAVENOUS

## 2018-04-08 MED ORDER — ONDANSETRON HCL 4 MG/2ML IJ SOLN: 4.0000 mg | INTRAMUSCULAR | Status: DC | PRN

## 2018-04-08 MED ORDER — ACETAMINOPHEN 10 MG/ML IV SOLN
INTRAVENOUS | Status: DC | PRN
  Administered 2018-04-08: 1000 mg via INTRAVENOUS

## 2018-04-08 MED ORDER — PHENYLEPHRINE HCL 10 MG/ML IJ SOLN
INTRAMUSCULAR | Status: AC
  Filled 2018-04-08: qty 1

## 2018-04-08 MED ORDER — OXYCODONE HCL 5 MG PO TABS
5.0000 mg | ORAL_TABLET | Freq: Once | ORAL | Status: AC | PRN
  Administered 2018-04-08: 5 mg via ORAL

## 2018-04-08 MED ORDER — MIDAZOLAM HCL 2 MG/2ML IJ SOLN
INTRAMUSCULAR | Status: DC | PRN
  Administered 2018-04-08: 2 mg via INTRAVENOUS

## 2018-04-08 MED ORDER — LIDOCAINE HCL (PF) 2 % IJ SOLN
INTRAMUSCULAR | Status: AC
  Filled 2018-04-08: qty 10

## 2018-04-08 MED ORDER — ONDANSETRON HCL 4 MG/2ML IJ SOLN
INTRAMUSCULAR | Status: DC | PRN
  Administered 2018-04-08: 4 mg via INTRAVENOUS

## 2018-04-08 MED ORDER — TAMSULOSIN HCL 0.4 MG PO CAPS: 0.4000 mg | ORAL_CAPSULE | Freq: Every day | ORAL | Status: DC

## 2018-04-08 MED ORDER — DEXAMETHASONE SODIUM PHOSPHATE 10 MG/ML IJ SOLN
INTRAMUSCULAR | Status: AC
  Filled 2018-04-08: qty 1

## 2018-04-08 SURGICAL SUPPLY — 24 items
ADAPTER IRRIG TUBE 2 SPIKE SOL (ADAPTER) ×4 IMPLANT
BAG DRAIN CYSTO-URO LG1000N (MISCELLANEOUS) ×2 IMPLANT
BAG URO DRAIN 4000ML (MISCELLANEOUS) ×2 IMPLANT
CATH FOL 2WAY LX 24X30 (CATHETERS) IMPLANT
CATH FOLEY 3WAY 30CC 22FR (CATHETERS) ×1 IMPLANT
DRAPE UTILITY 15X26 TOWEL STRL (DRAPES) ×2 IMPLANT
ELECT LOOP 22F BIPOLAR SML (ELECTROSURGICAL)
ELECTRODE LOOP 22F BIPOLAR SML (ELECTROSURGICAL) ×1 IMPLANT
GLOVE BIO SURGEON STRL SZ7.5 (GLOVE) ×2 IMPLANT
GOWN STRL REUS W/ TWL LRG LVL3 (GOWN DISPOSABLE) ×1 IMPLANT
GOWN STRL REUS W/ TWL XL LVL3 (GOWN DISPOSABLE) ×1 IMPLANT
GOWN STRL REUS W/TWL LRG LVL3 (GOWN DISPOSABLE) ×1
GOWN STRL REUS W/TWL XL LVL3 (GOWN DISPOSABLE) ×1
HOLDER FOLEY CATH W/STRAP (MISCELLANEOUS) ×3 IMPLANT
KIT TURNOVER CYSTO (KITS) ×2 IMPLANT
LOOP CUT BIPOLAR 24F LRG (ELECTROSURGICAL) ×2 IMPLANT
PACK CYSTO AR (MISCELLANEOUS) ×2 IMPLANT
SET IRRIG Y TYPE TUR BLADDER L (SET/KITS/TRAYS/PACK) ×2 IMPLANT
SET IRRIGATING DISP (SET/KITS/TRAYS/PACK) ×1 IMPLANT
SOL .9 NS 3000ML IRR  AL (IV SOLUTION) ×5
SOL .9 NS 3000ML IRR UROMATIC (IV SOLUTION) ×6 IMPLANT
SYR TOOMEY 50ML (SYRINGE) ×1 IMPLANT
SYRINGE IRR TOOMEY STRL 70CC (SYRINGE) ×2 IMPLANT
WATER STERILE IRR 1000ML POUR (IV SOLUTION) ×2 IMPLANT

## 2018-04-08 NOTE — Anesthesia Post-op Follow-up Note (Signed)
Anesthesia QCDR form completed.        

## 2018-04-08 NOTE — Op Note (Signed)
Date of procedure: 04/08/18  Preoperative diagnosis:  1. BPH, lower urinary tract symptoms  Postoperative diagnosis:  1. Same  Procedure: 1. Transurethral resection of the prostate  Surgeon: Legrand RamsBrian Rishav Rockefeller, MD  Anesthesia: General  Complications: None  Intraoperative findings:  1. Large intravesical median lobe, short prostate, high bladder neck 2.  Normal cystoscopy, mildly trabeculated bladder  EBL: Minimal  Specimens: Prostate chips  Drains: 22 French three-way Foley catheter  Indication: Derek Fields is a 57 y.o. patient with severe lower urinary tract symptoms.  Cystoscopy in clinic demonstrated a short prostate with significant intravesical median lobe, as well as a high bladder neck.  Transrectal ultrasound demonstrated a 36 g gland.  After reviewing the management options for treatment, they elected to proceed with the above surgical procedure(s). We have discussed the potential benefits and risks of the procedure, side effects of the proposed treatment, the likelihood of the patient achieving the goals of the procedure, and any potential problems that might occur during the procedure or recuperation. Informed consent has been obtained.  Description of procedure:  The patient was taken to the operating room and general anesthesia was induced.  The patient was placed in the dorsal lithotomy position, prepped and draped in the usual sterile fashion, and preoperative antibiotics were administered. A preoperative time-out was performed.   The 5226 French resectoscope was inserted into the bladder using the visual obturator.  The prostate was short with a high bladder neck, and significant intravesical median lobe.  Bladder mucosa was grossly normal, with mild trabeculation throughout. The large bipolar resecting loop was used to resect the median lobe.  We then methodically resected the lateral lobes and lowered the bladder neck.  The bladder neck was not undermined.  Thorough  hemostasis was achieved under low flow state.  At the conclusion of the procedure the uteral orifices and verumontanum were intact.  There was a wide open prostatic channel.  The chips were all evacuated and cystoscopy demonstrated no residual tissue.  A 22 French three-way catheter passed easily into the bladder with return of faint pink urine.  CBI was started and urine was crystal clear on a medium drip.   Disposition: Stable to PACU  Plan: Void trial tomorrow morning and discharge Follow up in 4-6 weeks for uroflow/PVR  Legrand RamsBrian Kenna Kirn, MD

## 2018-04-08 NOTE — Transfer of Care (Signed)
Immediate Anesthesia Transfer of Care Note  Patient: Derek LayerRuddy F Benedetti  Procedure(s) Performed: Procedure(s) with comments: TRANSURETHRAL RESECTION OF THE PROSTATE (TURP) (N/A) - Bipolar  Patient Location: PACU  Anesthesia Type:General  Level of Consciousness: sedated  Airway & Oxygen Therapy: Patient Spontanous Breathing and Patient connected to face mask oxygen  Post-op Assessment: Report given to RN and Post -op Vital signs reviewed and stable  Post vital signs: Reviewed and stable  Last Vitals:  Vitals:   04/08/18 0623 04/08/18 0856  BP: (!) 145/88 108/73  Pulse: 84 64  Resp: 14 16  Temp: (!) 36.3 C 36.4 C  SpO2: 100% 100%    Complications: No apparent anesthesia complications

## 2018-04-08 NOTE — Anesthesia Preprocedure Evaluation (Signed)
Anesthesia Evaluation  Patient identified by MRN, date of birth, ID band Patient awake    Reviewed: Allergy & Precautions, H&P , NPO status , Patient's Chart, lab work & pertinent test results  Airway Mallampati: II  TM Distance: >3 FB Neck ROM: full    Dental  (+) Teeth Intact   Pulmonary neg pulmonary ROS, Current Smoker,    breath sounds clear to auscultation       Cardiovascular hypertension, negative cardio ROS   Rhythm:regular Rate:Normal     Neuro/Psych negative neurological ROS  negative psych ROS   GI/Hepatic negative GI ROS, Neg liver ROS, GERD  Controlled,  Endo/Other  negative endocrine ROS  Renal/GU negative Renal ROS  negative genitourinary   Musculoskeletal  (+) Arthritis ,   Abdominal   Peds  Hematology negative hematology ROS (+)   Anesthesia Other Findings Past Medical History: No date: Arthritis No date: GERD (gastroesophageal reflux disease) No date: Hyperlipidemia No date: Hypertension  Past Surgical History: 1992: BLADDER SURGERY     Comment:  "trimmed up" bladder outlet No date: CERVICAL DISC SURGERY 2010: NECK SURGERY     Comment:  cadaver bone inserted along with a plate for ddd [id number]: SHOULDER SURGERY     Comment:  right shoulder  BMI    Body Mass Index:  26.45 kg/m      Reproductive/Obstetrics negative OB ROS                             Anesthesia Physical Anesthesia Plan  ASA: II  Anesthesia Plan: General and General ETT   Post-op Pain Management:    Induction:   PONV Risk Score and Plan: Ondansetron and Dexamethasone  Airway Management Planned:   Additional Equipment:   Intra-op Plan:   Post-operative Plan:   Informed Consent: I have reviewed the patients History and Physical, chart, labs and discussed the procedure including the risks, benefits and alternatives for the proposed anesthesia with the patient or authorized  representative who has indicated his/her understanding and acceptance.   Dental Advisory Given  Plan Discussed with: Anesthesiologist, CRNA and Surgeon  Anesthesia Plan Comments:         Anesthesia Quick Evaluation

## 2018-04-08 NOTE — Anesthesia Procedure Notes (Signed)
Procedure Name: Intubation Date/Time: 04/08/2018 7:52 AM Performed by: Doreen Salvage, CRNA Pre-anesthesia Checklist: Patient identified, Patient being monitored, Timeout performed, Emergency Drugs available and Suction available Patient Re-evaluated:Patient Re-evaluated prior to induction Oxygen Delivery Method: Circle system utilized Preoxygenation: Pre-oxygenation with 100% oxygen Induction Type: IV induction and Cricoid Pressure applied Ventilation: Mask ventilation without difficulty Laryngoscope Size: Mac and 3 Grade View: Grade II Tube type: Oral Tube size: 7.5 mm Number of attempts: 1 Airway Equipment and Method: Stylet Placement Confirmation: ETT inserted through vocal cords under direct vision,  positive ETCO2 and breath sounds checked- equal and bilateral Secured at: 23 cm Tube secured with: Tape Dental Injury: Teeth and Oropharynx as per pre-operative assessment  Difficulty Due To: Difficult Airway- due to anterior larynx

## 2018-04-08 NOTE — H&P (Signed)
UROLOGY H&P  Agree with H&P dated 8/12  Cardiac: RRR Lungs: CTA bilaterally  57 yo M here for bipolar TURP, admit overnight  Urine culture 8/14 with 10K-25K streptococcus mitis, has been on 5 days cx appropriate abx  Risks and benefits discussed, informed consent obtained. Specifically discussed risks of bleeding, infection, need for additional procedures, and retrograde ejaculation.  Sondra ComeBrian C Safiyya Stokes, MD 04/08/2018

## 2018-04-09 DIAGNOSIS — N401 Enlarged prostate with lower urinary tract symptoms: Secondary | ICD-10-CM | POA: Diagnosis not present

## 2018-04-09 MED ORDER — HYDROCODONE-ACETAMINOPHEN 5-325 MG PO TABS: 1.0000 | ORAL_TABLET | Freq: Four times a day (QID) | ORAL | 0 refills | Status: AC | PRN

## 2018-04-09 NOTE — Addendum Note (Signed)
Addended by: Sondra ComeSNINSKY, Tiago Humphrey C on: 04/09/2018 04:06 PM   Modules accepted: Orders

## 2018-04-09 NOTE — Progress Notes (Signed)
Pt is in no acute distress, VSS. Pt has voided 200 ml of pink clear fluid. RN explained discharge instructions to pt and pt verbalized understanding. Pt prefers [facility]. Pt wheeled to visitors entrance by NT.

## 2018-04-10 NOTE — Anesthesia Postprocedure Evaluation (Signed)
Anesthesia Post Note  Patient: Derek Fields  Procedure(s) Performed: TRANSURETHRAL RESECTION OF THE PROSTATE (TURP) (N/A Prostate)  Patient location during evaluation: PACU Anesthesia Type: General Level of consciousness: awake and alert Pain management: pain level controlled Vital Signs Assessment: post-procedure vital signs reviewed and stable Respiratory status: spontaneous breathing, nonlabored ventilation, respiratory function stable and patient connected to face mask oxygen Cardiovascular status: blood pressure returned to baseline and stable Postop Assessment: no apparent nausea or vomiting Anesthetic complications: no     Last Vitals:  Vitals:   04/09/18 0605 04/09/18 1236  BP: (!) 145/88 129/75  Pulse: 77 (!) 49  Resp:  16  Temp:  36.5 C  SpO2: 97% 92%    Last Pain:  Vitals:   04/09/18 1236  TempSrc: Oral  PainSc:                  Jovita Gamma

## 2018-04-12 LAB — SURGICAL PATHOLOGY

## 2018-04-12 NOTE — Discharge Summary (Signed)
Physician Discharge Summary   Patient ID: Derek Fields [id number] 57 y.o. 12-22-1960  Admit date: 04/08/2018  Discharge date and time: 04/09/2018  1:40 PM   Admitting Physician: Sondra Come, MD   Discharge Physician: Richardo Hanks, MD  Admission Diagnoses: BPH (benign prostatic hyperplasia) [N40.0]  Discharge Diagnoses: bph  Admission Condition: good  Discharged Condition: good  Indication for Admission: Post TURP  Hospital Course: Mr. Galioto underwent TURP on 8/30 and was admitted post-op. He did well. Foley was draining yellow urine off CBI and was removed POD#1. He passed a void trial and was discharged home.  Consults: None  Significant Diagnostic Studies: None  Treatments: surgery: TURP  Discharge Exam: BP 129/75   Pulse (!) 49   Temp 97.7 F (36.5 C) (Oral)   Resp 16   Ht 5\' 5"  (1.651 m)   Wt 72.1 kg   SpO2 92%   BMI 26.45 kg/m   General Appearance:    Alert, cooperative, no distress, appears stated age  Abdomen: Soft, NT                                                        Disposition: Discharge disposition: 01-Home or Self Care       Patient Instructions:  Allergies as of 04/09/2018   No Known Allergies     Medication List    STOP taking these medications   finasteride 5 MG tablet Commonly known as:  PROSCAR     TAKE these medications   ALIGN PREBIOTIC-PROBIOTIC PO Take 1 capsule by mouth at bedtime.   amLODipine 5 MG tablet Commonly known as:  NORVASC Take 1 tablet (5 mg total) by mouth daily. What changed:    how much to take  when to take this   aspirin EC 81 MG tablet Take 81 mg by mouth daily.   doxazosin 2 MG tablet Commonly known as:  CARDURA TAKE 2 TABLETS BY MOUTH EVERY DAY **PT NEEDS OFFICE VISIT FOR FOLLOW-UP** What changed:  See the new instructions.   EPINEPHrine 0.3 mg/0.3 mL Soaj injection Commonly known as:  EPI-PEN Inject 0.3 mg into the muscle once.   esomeprazole 40 MG capsule Commonly  known as:  NEXIUM TAKE ONE CAPSULE BY MOUTH EVERY DAY   polyethylene glycol packet Commonly known as:  MIRALAX / GLYCOLAX Take 17 g by mouth daily.   pravastatin 80 MG tablet Commonly known as:  PRAVACHOL TAKE 1 TABLET BY MOUTH EVERY DAY   senna-docusate 8.6-50 MG tablet Commonly known as:  Senokot-S Take 1 tablet by mouth daily.   tamsulosin 0.4 MG Caps capsule Commonly known as:  FLOMAX Take 0.4 mg by mouth daily after supper.      Activity: activity as tolerated Diet: regular diet Wound Care: none needed  Follow-up in 4-6 weeks for uroflow/PVR  Signed: Sondra Come 04/12/2018 8:17 AM

## 2018-04-20 ENCOUNTER — Telehealth: Payer: Self-pay | Admitting: Urology

## 2018-04-20 NOTE — Telephone Encounter (Signed)
Please advise 

## 2018-04-20 NOTE — Telephone Encounter (Signed)
Pt wants to ask Derek Fields about limitations at work.  He will need a work note to be picked up either tomorrow or Friday stating he was able to go back to work on 9/9 to on light duty.  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  [phone]

## 2018-04-21 ENCOUNTER — Telehealth: Payer: Self-pay

## 2018-04-21 NOTE — Telephone Encounter (Signed)
-----   Message from Sondra ComeBrian C Sninsky, MD sent at 04/21/2018  8:11 AM EDT ----- We can write him a letter for the light duty return to work 9/9. He can go back to work with no restrictions 3 weeks post op.  Sondra ComeBrian C Sninsky, MD 04/21/2018

## 2018-04-22 ENCOUNTER — Encounter: Payer: Self-pay | Admitting: Urology

## 2018-04-22 NOTE — Telephone Encounter (Signed)
Left message for pt to come pick up note, please provide pt note for return to work on 04/18/18 for light duty. He can return to work with no restrictions on 03/29/18.

## 2018-05-20 ENCOUNTER — Ambulatory Visit: Payer: Managed Care, Other (non HMO) | Admitting: Urology

## 2018-05-22 ENCOUNTER — Other Ambulatory Visit: Payer: Self-pay | Admitting: Family Medicine

## 2018-05-23 ENCOUNTER — Encounter: Payer: Self-pay | Admitting: Urology

## 2018-05-23 ENCOUNTER — Ambulatory Visit (INDEPENDENT_AMBULATORY_CARE_PROVIDER_SITE_OTHER): Payer: Managed Care, Other (non HMO) | Admitting: Urology

## 2018-05-23 VITALS — BP 146/84 | HR 72 | Ht 65.0 in | Wt 161.8 lb

## 2018-05-23 DIAGNOSIS — N401 Enlarged prostate with lower urinary tract symptoms: Secondary | ICD-10-CM | POA: Diagnosis not present

## 2018-05-23 LAB — BLADDER SCAN AMB NON-IMAGING

## 2018-05-23 NOTE — Progress Notes (Signed)
Urology clinic note  I saw [name] in urology clinic today for follow-up after undergoing transurethral resection of the prostate on 04/08/2018.  He is a 57 year old healthy man that had a small 36 g gland with a significant median lobe and high bladder neck.  TURP was uncomplicated.  Pathology showed benign prostate tissue.  He is doing excellent postop.  He reports he has a strong stream with no urgency or frequency, and his urinary symptoms have completely resolved.  He denies any hematuria or dysuria.  He will follow-up as needed  Legrand Rams, MD 05/23/2018  Greater than 10 minutes were spent with the patient today, greater than 50% were spent in direct patient education counseling regarding BPH and TURP.

## 2018-07-15 ENCOUNTER — Other Ambulatory Visit: Payer: Self-pay | Admitting: Family Medicine

## 2018-07-15 DIAGNOSIS — I10 Essential (primary) hypertension: Secondary | ICD-10-CM

## 2018-10-17 ENCOUNTER — Telehealth: Payer: Self-pay | Admitting: *Deleted

## 2018-10-17 ENCOUNTER — Encounter: Payer: Self-pay | Admitting: *Deleted

## 2018-10-17 DIAGNOSIS — Z122 Encounter for screening for malignant neoplasm of respiratory organs: Secondary | ICD-10-CM

## 2018-10-17 DIAGNOSIS — Z87891 Personal history of nicotine dependence: Secondary | ICD-10-CM

## 2018-10-17 NOTE — Telephone Encounter (Signed)
Received referral for initial lung cancer screening scan. Contacted patient and obtained smoking history,(current, 35 pack year) as well as answering questions related to screening process. Patient denies signs of lung cancer such as weight loss or hemoptysis. Patient denies comorbidity that would prevent curative treatment if lung cancer were found. Patient is scheduled for shared decision making visit and CT scan on 11/03/18 at 130pm.

## 2018-10-27 ENCOUNTER — Telehealth: Payer: Self-pay | Admitting: *Deleted

## 2018-10-27 NOTE — Telephone Encounter (Signed)
Patient notified/message left to notify patient that due to current restrictions lung screening appointments are cancelled and patient will be contacted regarding rescheduling.  

## 2018-11-03 ENCOUNTER — Ambulatory Visit: Payer: Self-pay

## 2018-11-03 ENCOUNTER — Inpatient Hospital Stay: Payer: Managed Care, Other (non HMO) | Admitting: Nurse Practitioner

## 2018-12-21 ENCOUNTER — Telehealth: Payer: Self-pay | Admitting: *Deleted

## 2018-12-21 NOTE — Telephone Encounter (Signed)
Left voicemail in attempt to reschedule lung screening scan.  

## 2018-12-29 ENCOUNTER — Telehealth: Payer: Self-pay | Admitting: *Deleted

## 2018-12-29 NOTE — Telephone Encounter (Signed)
Patient contacted and rescheduled for lung screening scan, see prior note for eligibility information.

## 2019-01-05 ENCOUNTER — Ambulatory Visit: Payer: Managed Care, Other (non HMO) | Admitting: Nurse Practitioner

## 2019-01-05 ENCOUNTER — Inpatient Hospital Stay: Payer: Managed Care, Other (non HMO) | Attending: Nurse Practitioner | Admitting: Hospice and Palliative Medicine

## 2019-01-05 ENCOUNTER — Other Ambulatory Visit: Payer: Self-pay

## 2019-01-05 ENCOUNTER — Ambulatory Visit
Admission: RE | Admit: 2019-01-05 | Discharge: 2019-01-05 | Disposition: A | Discharge status: Home / Self Care | Payer: Managed Care, Other (non HMO) | Source: Ambulatory Visit | Attending: Nurse Practitioner | Admitting: Nurse Practitioner

## 2019-01-05 DIAGNOSIS — Z122 Encounter for screening for malignant neoplasm of respiratory organs: Secondary | ICD-10-CM

## 2019-01-05 DIAGNOSIS — Z716 Tobacco abuse counseling: Secondary | ICD-10-CM | POA: Diagnosis not present

## 2019-01-05 DIAGNOSIS — Z87891 Personal history of nicotine dependence: Secondary | ICD-10-CM

## 2019-01-05 NOTE — Progress Notes (Signed)
In accordance with CMS guidelines, patient has met eligibility criteria including age, absence of signs or symptoms of lung cancer.  Social History   Tobacco Use  . Smoking status: Current Every Day Smoker    Packs/day: 1.25    Years: 28.00    Pack years: 35.00    Types: Cigarettes  . Smokeless tobacco: Former User    Types: Chew    Quit date: 2010  . Tobacco comment: Stareted age 58 1ppd, quit 10 years, restarted  age 40.   Substance Use Topics  . Alcohol use: Yes    Alcohol/week: 14.0 standard drinks    Types: 14 Cans of beer per week    Comment: depending if on call  . Drug use: No      A shared decision-making session was conducted prior to the performance of CT scan. This includes one or more decision aids, includes benefits and harms of screening, follow-up diagnostic testing, over-diagnosis, false positive rate, and total radiation exposure.   Counseling on the importance of adherence to annual lung cancer LDCT screening, impact of co-morbidities, and ability or willingness to undergo diagnosis and treatment is imperative for compliance of the program.   Counseling on the importance of continued smoking cessation for former smokers; the importance of smoking cessation for current smokers, and information about tobacco cessation interventions have been given to patient including [place] [facility] and 1800 quit [place] programs.   Written order for lung cancer screening with LDCT has been given to the patient and any and all questions have been answered to the best of my abilities.    Yearly follow up will be coordinated by Shawn Perkins, Thoracic Navigator.  Time Total: 15 minutes  Visit consisted of counseling and education dealing with the complex health screening.Greater than 50%  of this time was spent counseling and coordinating care related to the above assessment and plan.  Signed by: Joshua Borders, PhD, NP-C [phone] (Work Cell)  

## 2019-01-06 ENCOUNTER — Telehealth: Payer: Self-pay | Admitting: *Deleted

## 2019-01-06 NOTE — Telephone Encounter (Signed)
Notified patient of LDCT lung cancer screening program results with recommendation for 12 month follow up imaging. Also notified of incidental findings noted below and is encouraged to discuss further with PCP who will receive a copy of this note and/or the CT report. Patient verbalizes understanding.   IMPRESSION: 1. Lung-RADS 2, benign appearance or behavior. Continue annual screening with low-dose chest CT without contrast in 12 months. 2. Aortic Atherosclerosis (ICD10-I70.0) and Emphysema (ICD10-J43.9). 3. Coronary artery calcifications.  

## 2019-03-04 ENCOUNTER — Other Ambulatory Visit: Payer: Self-pay | Admitting: Family Medicine

## 2019-05-04 ENCOUNTER — Other Ambulatory Visit: Payer: Self-pay | Admitting: *Deleted

## 2019-05-04 DIAGNOSIS — Z20822 Contact with and (suspected) exposure to covid-19: Secondary | ICD-10-CM

## 2019-05-05 ENCOUNTER — Other Ambulatory Visit: Payer: Self-pay

## 2019-05-05 ENCOUNTER — Ambulatory Visit: Payer: Managed Care, Other (non HMO) | Admitting: Adult Health

## 2019-05-05 ENCOUNTER — Encounter: Payer: Self-pay | Admitting: Adult Health

## 2019-05-05 VITALS — Temp 97.9°F

## 2019-05-05 DIAGNOSIS — R05 Cough: Secondary | ICD-10-CM

## 2019-05-05 DIAGNOSIS — Z7189 Other specified counseling: Secondary | ICD-10-CM

## 2019-05-05 DIAGNOSIS — R059 Cough, unspecified: Secondary | ICD-10-CM

## 2019-05-05 DIAGNOSIS — R6889 Other general symptoms and signs: Secondary | ICD-10-CM | POA: Diagnosis not present

## 2019-05-05 DIAGNOSIS — Z20822 Contact with and (suspected) exposure to covid-19: Secondary | ICD-10-CM

## 2019-05-05 LAB — NOVEL CORONAVIRUS, NAA: SARS-CoV-2, NAA: NOT DETECTED

## 2019-05-05 MED ORDER — AZITHROMYCIN 250 MG PO TABS: ORAL_TABLET | ORAL | 0 refills | Status: DC

## 2019-05-05 NOTE — Progress Notes (Addendum)
Virtual Visit via Telephone Note  I connected with Derek Fields on 05/05/19 at  2:00 PM EDT by telephone and verified that I am speaking with the correct person using two identifiers.  Location: Patient: at home  Provider: [facility], [place], [place]     I discussed the limitations, risks, security and privacy concerns of performing an evaluation and management service by telephone and the availability of in person appointments. I also discussed with the patient that there may be a patient responsible charge related to this service. The patient expressed understanding and agreed to proceed.   History of Present Illness: Patient is a 58 year old male in no acute distress who calls the clinic for a virtual telephone visit.   He reports he was tested for Covid 19 two days ago and received negative test results today.  He is at home due to sent home.   He has a cough, congestion, cough and sneezing that started on 05/01/2019. He denies any fever. He has been taking Mucinex day and Mucinex Night as needed. He has taken tylenol in the PRN as well. Educated to not take more than 1000mg  of tylenol every 6 hours and the Mucinex cold does have tylenol. He states he feels like he is feeling some better today.  He is a smoker. He has mild COPD, emphysema. Does not have any home oxygen. He has no difficulty walking and no shortness of breath.  Sinus pressure under eyes and cheeks, forehead.  Productive clear cough. Clear nasal discharge. No ear pain.  Denies any body aches, or chills.   Patient  denies any fever, body aches,chills, rash, chest pain, shortness of breath, nausea, vomiting, or diarrhea.  He denies any distress.  He reports he is breathing normally as he always does. No ill contacts known.     Observations/Objective: Temperature reported as 98 to provider.  This was oral.  No other vital signs are available at home and he does not have a pulse  oximetry.  Patient is alert and oriented and responsive to questions Engages in conversation with provider. Speaks in full sentences without any pauses without any shortness of breath or distress.   He speaks at least 15+ words over the fire and has no difficulty with that, no stridor or wheezing is heard.  Voice is not hoarse.  Assessment and Plan:    ICD-10-CM   1. Suspected Covid-19 Virus Infection  R68.89   2. Cough  R05   3. Advice Given About Covid-19 Virus by Telephone  Z71.89     Follow Up Instructions: He does have a pulmonologist and will follow-up with pulmonologist if any symptoms worsen, if it is after hours he will go to the emergency room. Given history of COPD and most recent CT scan that he had for low-dose annual, showing possible emphysema will treat with azithromycin to cover any bacterial infection as patient cannot be seen in the office today to cover pandemic.  He realizes that he should be seen in person should any symptoms change or worsen or he need another visit.   Orienting work note was given with return date of May 15, 2019 he can find this in his MyChart account and it is located in communications under this chart. I discussed the assessment and treatment plan with the patient. The patient was provided an opportunity to ask questions and all were answered. The patient agreed with the plan and demonstrated an understanding of  the instructions.   The patient was advised to call back or seek an in-person evaluation if the symptoms worsen or if the condition fails to improve as anticipated.  I provided 15 minutes of non-face-to-face time during this encounter.   Jairo Ben, FNP

## 2019-05-05 NOTE — Progress Notes (Signed)
No Shortness of breath, Denies fever, Taking Night & Day Mucinex cough syrup and Tylenol. Denies sore throat.

## 2019-05-05 NOTE — Patient Instructions (Signed)
Azithromycin tablets What is this medicine? AZITHROMYCIN (az ith roe MYE sin) is a macrolide antibiotic. It is used to treat or prevent certain kinds of bacterial infections. It will not work for colds, flu, or other viral infections. This medicine may be used for other purposes; ask your health care provider or pharmacist if you have questions. COMMON BRAND NAME(S): Zithromax, Zithromax Tri-Pak, Zithromax Z-Pak What should I tell my health care provider before I take this medicine? They need to know if you have any of these conditions:  history of blood diseases, like leukemia  history of irregular heartbeat  kidney disease  liver disease  myasthenia gravis  an unusual or allergic reaction to azithromycin, erythromycin, other macrolide antibiotics, foods, dyes, or preservatives  pregnant or trying to get pregnant  breast-feeding How should I use this medicine? Take this medicine by mouth with a full glass of water. Follow the directions on the prescription label. The tablets can be taken with food or on an empty stomach. If the medicine upsets your stomach, take it with food. Take your medicine at regular intervals. Do not take your medicine more often than directed. Take all of your medicine as directed even if you think your are better. Do not skip doses or stop your medicine early. Talk to your pediatrician regarding the use of this medicine in children. While this drug may be prescribed for children as young as 6 months for selected conditions, precautions do apply. Overdosage: If you think you have taken too much of this medicine contact a poison control center or emergency room at once. NOTE: This medicine is only for you. Do not share this medicine with others. What if I miss a dose? If you miss a dose, take it as soon as you can. If it is almost time for your next dose, take only that dose. Do not take double or extra doses. What may interact with this medicine? Do not take  this medicine with any of the following medications:  cisapride  dronedarone  pimozide  thioridazine This medicine may also interact with the following medications:  antacids that contain aluminum or magnesium  birth control pills  colchicine  cyclosporine  digoxin  ergot alkaloids like dihydroergotamine, ergotamine  nelfinavir  other medicines that prolong the QT interval (an abnormal heart rhythm)  phenytoin  warfarin This list may not describe all possible interactions. Give your health care provider a list of all the medicines, herbs, non-prescription drugs, or dietary supplements you use. Also tell them if you smoke, drink alcohol, or use illegal drugs. Some items may interact with your medicine. What should I watch for while using this medicine? Tell your doctor or healthcare provider if your symptoms do not start to get better or if they get worse. This medicine may cause serious skin reactions. They can happen weeks to months after starting the medicine. Contact your healthcare provider right away if you notice fevers or flu-like symptoms with a rash. The rash may be red or purple and then turn into blisters or peeling of the skin. Or, you might notice a red rash with swelling of the face, lips or lymph nodes in your neck or under your arms. Do not treat diarrhea with over the counter products. Contact your doctor if you have diarrhea that lasts more than 2 days or if it is severe and watery. This medicine can make you more sensitive to the sun. Keep out of the sun. If you cannot avoid being in the   sun, wear protective clothing and use sunscreen. Do not use sun lamps or tanning beds/booths. What side effects may I notice from receiving this medicine? Side effects that you should report to your doctor or health care professional as soon as possible:  allergic reactions like skin rash, itching or hives, swelling of the face, lips, or tongue  bloody or watery diarrhea   breathing problems  chest pain  fast, irregular heartbeat  muscle weakness  rash, fever, and swollen lymph nodes  redness, blistering, peeling, or loosening of the skin, including inside the mouth  signs and symptoms of liver injury like dark yellow or brown urine; general ill feeling or flu-like symptoms; light-colored stools; loss of appetite; nausea; right upper belly pain; unusually weak or tired; yellowing of the eyes or skin  white patches or sores in the mouth  unusually weak or tired Side effects that usually do not require medical attention (report to your doctor or health care professional if they continue or are bothersome):  diarrhea  nausea  stomach pain  vomiting This list may not describe all possible side effects. Call your doctor for medical advice about side effects. You may report side effects to FDA at 1-800-FDA-1088. Where should I keep my medicine? Keep out of the reach of children. Store at room temperature between 15 and 30 degrees C (59 and 86 degrees F). Throw away any unused medicine after the expiration date. NOTE: This sheet is a summary. It may not cover all possible information. If you have questions about this medicine, talk to your doctor, pharmacist, or health care provider.  2020 Elsevier/Gold Standard (2018-11-03 17:19:20) Cough, Adult A cough helps to clear your throat and lungs. A cough may be a sign of an illness or another medical condition. An acute cough may only last 2-3 weeks, while a chronic cough may last 8 or more weeks. Many things can cause a cough. They include:  Germs (viruses or bacteria) that attack the airway.  Breathing in things that bother (irritate) your lungs.  Allergies.  Asthma.  Mucus that runs down the back of your throat (postnasal drip).  Smoking.  Acid backing up from the stomach into the tube that moves food from the mouth to the stomach (gastroesophageal reflux).  Some medicines.  Lung problems.   Other medical conditions, such as heart failure or a blood clot in the lung (pulmonary embolism). Follow these instructions at home: Medicines  Take over-the-counter and prescription medicines only as told by your doctor.  Talk with your doctor before you take medicines that stop a cough (coughsuppressants). Lifestyle   Do not smoke, and try not to be around smoke. Do not use any products that contain nicotine or tobacco, such as cigarettes, e-cigarettes, and chewing tobacco. If you need help quitting, ask your doctor.  Drink enough fluid to keep your pee (urine) pale yellow.  Avoid caffeine.  Do not drink alcohol if your doctor tells you not to drink. General instructions   Watch for any changes in your cough. Tell your doctor about them.  Always cover your mouth when you cough.  Stay away from things that make you cough, such as perfume, candles, campfire smoke, or cleaning products.  If the air is dry, use a cool mist vaporizer or humidifier in your home.  If your cough is worse at night, try using extra pillows to raise your head up higher while you sleep.  Rest as needed.  Keep all follow-up visits as told by your  doctor. This is important. Contact a doctor if:  You have new symptoms.  You cough up pus.  Your cough does not get better after 2-3 weeks, or your cough gets worse.  Cough medicine does not help your cough and you are not sleeping well.  You have pain that gets worse or pain that is not helped with medicine.  You have a fever.  You are losing weight and you do not know why.  You have night sweats. Get help right away if:  You cough up blood.  You have trouble breathing.  Your heartbeat is very fast. These symptoms may be an emergency. Do not wait to see if the symptoms will go away. Get medical help right away. Call your local emergency services (911 in the [place] Do not drive yourself to the hospital. Summary  A cough helps to clear your  throat and lungs. Many things can cause a cough.  Take over-the-counter and prescription medicines only as told by your doctor.  Always cover your mouth when you cough.  Contact a doctor if you have new symptoms or you have a cough that does not get better or gets worse. This information is not intended to replace advice given to you by your health care provider. Make sure you discuss any questions you have with your health care provider. Document Released: 04/09/2011 Document Revised: 08/15/2018 Document Reviewed: 08/15/2018 Elsevier Patient Education  2020 Elsevier Inc.    Person Under Monitoring Name: Derek Fields  Location: [address] [place] [address]   Infection Prevention Recommendations for Individuals Confirmed to have, or Being Evaluated for, 2019 Novel Coronavirus (COVID-19) Infection Who Receive Care at Home  Individuals who are confirmed to have, or are being evaluated for, COVID-19 should follow the prevention steps below until a healthcare provider or local or state health department says they can return to normal activities.  Stay home except to get medical care You should restrict activities outside your home, except for getting medical care. Do not go to work, school, or public areas, and do not use public transportation or taxis.  Call ahead before visiting your doctor Before your medical appointment, call the healthcare provider and tell them that you have, or are being evaluated for, COVID-19 infection. This will help the healthcare provider's office take steps to keep other people from getting infected. Ask your healthcare provider to call the local or state health department.  Monitor your symptoms Seek prompt medical attention if your illness is worsening (e.g., difficulty breathing). Before going to your medical appointment, call the healthcare provider and tell them that you have, or are being evaluated for, COVID-19 infection. Ask your healthcare  provider to call the local or state health department.  Wear a facemask You should wear a facemask that covers your nose and mouth when you are in the same room with other people and when you visit a healthcare provider. People who live with or visit you should also wear a facemask while they are in the same room with you.  Separate yourself from other people in your home As much as possible, you should stay in a different room from other people in your home. Also, you should use a separate bathroom, if available.  Avoid sharing household items You should not share dishes, drinking glasses, cups, eating utensils, towels, bedding, or other items with other people in your home. After using these items, you should wash them thoroughly with soap and water.  Cover your  coughs and sneezes Cover your mouth and nose with a tissue when you cough or sneeze, or you can cough or sneeze into your sleeve. Throw used tissues in a lined trash can, and immediately wash your hands with soap and water for at least 20 seconds or use an alcohol-based hand rub.  Wash your [employer] your hands often and thoroughly with soap and water for at least 20 seconds. You can use an alcohol-based hand sanitizer if soap and water are not available and if your hands are not visibly dirty. Avoid touching your eyes, nose, and mouth with unwashed hands.   Prevention Steps for Caregivers and Household Members of Individuals Confirmed to have, or Being Evaluated for, COVID-19 Infection Being Cared for in the Home  If you live with, or provide care at home for, a person confirmed to have, or being evaluated for, COVID-19 infection please follow these guidelines to prevent infection:  Follow healthcare provider's instructions Make sure that you understand and can help the patient follow any healthcare provider instructions for all care.  Provide for the patient's basic needs You should help the patient with basic needs  in the home and provide support for getting groceries, prescriptions, and other personal needs.  Monitor the patient's symptoms If they are getting sicker, call his or her medical provider and tell them that the patient has, or is being evaluated for, COVID-19 infection. This will help the healthcare provider's office take steps to keep other people from getting infected. Ask the healthcare provider to call the local or state health department.  Limit the number of people who have contact with the patient  If possible, have only one caregiver for the patient.  Other household members should stay in another home or place of residence. If this is not possible, they should stay  in another room, or be separated from the patient as much as possible. Use a separate bathroom, if available.  Restrict visitors who do not have an essential need to be in the home.  Keep older adults, very young children, and other sick people away from the patient Keep older adults, very young children, and those who have compromised immune systems or chronic health conditions away from the patient. This includes people with chronic heart, lung, or kidney conditions, diabetes, and cancer.  Ensure good ventilation Make sure that shared spaces in the home have good air flow, such as from an air conditioner or an opened window, weather permitting.  Wash your hands often  Wash your hands often and thoroughly with soap and water for at least 20 seconds. You can use an alcohol based hand sanitizer if soap and water are not available and if your hands are not visibly dirty.  Avoid touching your eyes, nose, and mouth with unwashed hands.  Use disposable paper towels to dry your hands. If not available, use dedicated cloth towels and replace them when they become wet.  Wear a facemask and gloves  Wear a disposable facemask at all times in the room and gloves when you touch or have contact with the patient's blood,  body fluids, and/or secretions or excretions, such as sweat, saliva, sputum, nasal mucus, vomit, urine, or feces.  Ensure the mask fits over your nose and mouth tightly, and do not touch it during use.  Throw out disposable facemasks and gloves after using them. Do not reuse.  Wash your hands immediately after removing your facemask and gloves.  If your personal clothing becomes contaminated, carefully  remove clothing and launder. Wash your hands after handling contaminated clothing.  Place all used disposable facemasks, gloves, and other waste in a lined container before disposing them with other household waste.  Remove gloves and wash your hands immediately after handling these items.  Do not share dishes, glasses, or other household items with the patient  Avoid sharing household items. You should not share dishes, drinking glasses, cups, eating utensils, towels, bedding, or other items with a patient who is confirmed to have, or being evaluated for, COVID-19 infection.  After the person uses these items, you should wash them thoroughly with soap and water.  Wash laundry thoroughly  Immediately remove and wash clothes or bedding that have blood, body fluids, and/or secretions or excretions, such as sweat, saliva, sputum, nasal mucus, vomit, urine, or feces, on them.  Wear gloves when handling laundry from the patient.  Read and follow directions on labels of laundry or clothing items and detergent. In general, wash and dry with the warmest temperatures recommended on the label.  Clean all areas the individual has used often  Clean all touchable surfaces, such as counters, tabletops, doorknobs, bathroom fixtures, toilets, phones, keyboards, tablets, and bedside tables, every day. Also, clean any surfaces that may have blood, body fluids, and/or secretions or excretions on them.  Wear gloves when cleaning surfaces the patient has come in contact with.  Use a diluted bleach solution  (e.g., dilute bleach with 1 part bleach and 10 parts water) or a household disinfectant with a label that says EPA-registered for coronaviruses. To make a bleach solution at home, add 1 tablespoon of bleach to 1 quart (4 cups) of water. For a larger supply, add  cup of bleach to 1 gallon (16 cups) of water.  Read labels of cleaning products and follow recommendations provided on product labels. Labels contain instructions for safe and effective use of the cleaning product including precautions you should take when applying the product, such as wearing gloves or eye protection and making sure you have good ventilation during use of the product.  Remove gloves and wash hands immediately after cleaning.  Monitor yourself for signs and symptoms of illness Caregivers and household members are considered close contacts, should monitor their health, and will be asked to limit movement outside of the home to the extent possible. Follow the monitoring steps for close contacts listed on the symptom monitoring form.   ? If you have additional questions, contact your local health department or call the epidemiologist on call at [phone] (available 24/7). ? This guidance is subject to change. For the most up-to-date guidance from CDC, please refer to their website: https://www.taylor.biz/: How to Protect Yourself and Others Know how it spreads  There is currently no vaccine to prevent coronavirus disease 2019 (COVID-19).  The best way to prevent illness is to avoid being exposed to this virus.  The virus is thought to spread mainly from person-to-person. ? Between people who are in close contact with one another (within about 6 feet). ? Through respiratory droplets produced when an infected person coughs, sneezes or talks. ? These droplets can land in the mouths or noses of people who are nearby or possibly be inhaled into the lungs. ? Some  recent studies have suggested that COVID-19 may be spread by people who are not showing symptoms. Everyone should Clean your hands often  Wash your hands often with soap and water for at least 20 seconds especially after you have been in a public place,  or after blowing your nose, coughing, or sneezing.  If soap and water are not readily available, use a hand sanitizer that contains at least 60% alcohol. Cover all surfaces of your hands and rub them together until they feel dry.  Avoid touching your eyes, nose, and mouth with unwashed hands. Avoid close contact  Stay home if you are sick.  Avoid close contact with people who are sick.  Put distance between yourself and other people. ? Remember that some people without symptoms may be able to spread virus. ? This is especially important for people who are at higher risk of getting very [employer] Cover your mouth and nose with a cloth face cover when around others  You could spread COVID-19 to others even if you do not feel sick.  Everyone should wear a cloth face cover when they have to go out in public, for example to the grocery store or to pick up other necessities. ? Cloth face coverings should not be placed on young children under age 53, anyone who has trouble breathing, or is unconscious, incapacitated or otherwise unable to remove the mask without assistance.  The cloth face cover is meant to protect other people in case you are infected.  Do NOT use a facemask meant for a Research scientist (physical sciences).  Continue to keep about 6 feet between yourself and others. The cloth face cover is not a substitute for social distancing. Cover coughs and sneezes  If you are in a private setting and do not have on your cloth face covering, remember to always cover your mouth and nose with a tissue when you cough or sneeze or use the inside of your elbow.  Throw used tissues  in the trash.  Immediately wash your hands with soap and water for at least 20 seconds. If soap and water are not readily available, clean your hands with a hand sanitizer that contains at least 60% alcohol. Clean and disinfect  Clean AND disinfect frequently touched surfaces daily. This includes tables, doorknobs, light switches, countertops, handles, desks, phones, keyboards, toilets, faucets, and sinks. [employer]  If surfaces are dirty, clean them: Use detergent or soap and water prior to disinfection.  Then, use a household disinfectant. You can see a list of EPA-registered household disinfectants here. [employer] 12/13/2018 This information is not intended to replace advice given to you by your health care provider. Make sure you discuss any questions you have with your health care provider. Document Released: 11/22/2018 Document Revised: 12/21/2018 Document Reviewed: 11/22/2018 Elsevier Patient Education  2020 [employer].

## 2019-06-08 ENCOUNTER — Ambulatory Visit: Payer: Managed Care, Other (non HMO) | Admitting: Medical

## 2019-06-08 ENCOUNTER — Other Ambulatory Visit: Payer: Self-pay

## 2019-06-08 ENCOUNTER — Encounter: Payer: Self-pay | Admitting: Medical

## 2019-06-08 VITALS — BP 142/84 | HR 60 | Temp 97.9°F | Resp 18 | Ht 65.0 in | Wt 160.0 lb

## 2019-06-08 DIAGNOSIS — Z008 Encounter for other general examination: Secondary | ICD-10-CM | POA: Diagnosis not present

## 2019-06-08 NOTE — Progress Notes (Addendum)
Subjective:    Patient ID: Derek Fields, male    DOB: 1961/06/08, 58 y.o.   MRN: [id number]  HPI 58 yo male in non acute distress. Presents today for Biometric Screening.  He is only taking his blood pressure medication every few days due to side effects, he is going to discuss with his PCP this week. He works for [employer]. Blood pressure (!) 142/84, pulse 60, temperature 97.9 F (36.6 C), temperature source Temporal, resp. rate 18, height 5\' 5"  (1.651 m), weight 160 lb (72.6 kg), SpO2 98 %.  Has increased smoking  1.5 packs/day.   No Known Allergies  Current Outpatient Medications:  .  amLODipine-valsartan (EXFORGE) 10-160 MG tablet, Take by mouth., Disp: , Rfl:  .  aspirin EC 81 MG tablet, Take 81 mg by mouth daily. , Disp: , Rfl:  .  Bacillus Coagulans-Inulin (ALIGN PREBIOTIC-PROBIOTIC PO), Take 1 capsule by mouth at bedtime. , Disp: , Rfl:  .  doxazosin (CARDURA) 2 MG tablet, Take by mouth., Disp: , Rfl:  .  esomeprazole (NEXIUM) 40 MG capsule, TAKE ONE CAPSULE BY MOUTH EVERY DAY, Disp: 90 capsule, Rfl: 4 .  pravastatin (PRAVACHOL) 80 MG tablet, TAKE 1 TABLET BY MOUTH EVERY DAY, Disp: , Rfl:    Past Medical History:  Diagnosis Date  . Arthritis   . GERD (gastroesophageal reflux disease)   . Hyperlipidemia   . Hypertension   umbilical hernia Past Surgical History:  Procedure Laterality Date  . BLADDER SURGERY  1992   "trimmed up" bladder outlet  . CERVICAL DISC SURGERY    . NECK SURGERY  2010   cadaver bone inserted along with a plate for ddd  . SHOULDER SURGERY  2013   right shoulder  . TRANSURETHRAL RESECTION OF PROSTATE N/A 04/08/2018   Procedure: TRANSURETHRAL RESECTION OF THE PROSTATE (TURP);  Surgeon: 04/10/2018, MD;  Location: [facility] ORS;  Service: Urology;  Laterality: N/A;  Bipolar   Social History   Socioeconomic History  . Marital status: Married    Spouse name: micki  . Number of children: 1  . Years of education: Not on file   . Highest education level: Not on file  Occupational History  . Occupation: [employer]  Social Needs  . Financial resource strain: Not on file  . Food insecurity    Worry: Not on file    Inability: Not on file  . Transportation needs    Medical: Not on file    Non-medical: Not on file  Tobacco Use  . Smoking status: Current Every Day Smoker    Packs/day: 1.25    Years: 28.00    Pack years: 35.00    Types: Cigarettes  . Smokeless tobacco: Former Nurse, learning disability    Types: Chew    Quit date: 2010  . Tobacco comment: Stareted age 21 1ppd, quit 10 years, restarted  age 56.   Substance and Sexual Activity  . Alcohol use: Yes    Alcohol/week: 14.0 standard drinks    Types: 14 Cans of beer per week    Comment: depending if on call  . Drug use: No  . Sexual activity: Yes  Lifestyle  . Physical activity    Days per week: Not on file    Minutes per session: Not on file  . Stress: Not on file  Relationships  . Social 20 on phone: Not on file    Gets together: Not on file  Attends religious service: Not on file    Active member of club or organization: Not on file    Attends meetings of clubs or organizations: Not on file    Relationship status: Not on file  . Intimate partner violence    Fear of current or ex partner: Not on file    Emotionally abused: Not on file    Physically abused: Not on file    Forced sexual activity: Not on file  Other Topics Concern  . Not on file  Social History Narrative  . Not on file   Family History  Problem Relation Age of Onset  . Heart disease Mother   . Cancer Father        Brain cancer.  . Prostate cancer Neg Hx   . Bladder Cancer Neg Hx   . Kidney cancer Neg Hx    Review of Systems  All other systems reviewed and are negative.      Objective:   Physical Exam Vitals signs and nursing note reviewed.  Constitutional:      Appearance: Normal appearance. He is well-developed, well-groomed and normal weight.   HENT:     Head: Normocephalic and atraumatic.     Jaw: There is normal jaw occlusion.     Right Ear: Hearing, tympanic membrane, ear canal and external ear normal.     Left Ear: Hearing, tympanic membrane, ear canal and external ear normal.     Nose: Nose normal.     Mouth/Throat:     Lips: Pink.     Mouth: Mucous membranes are moist.     Pharynx: Oropharynx is clear. Uvula midline.     Tonsils: 1+ on the right. 1+ on the left.  Eyes:     General: Lids are normal.     Extraocular Movements: Extraocular movements intact.     Conjunctiva/sclera: Conjunctivae normal.     Pupils: Pupils are equal, round, and reactive to light.     Funduscopic exam:    Right eye: Red reflex present.        Left eye: Red reflex present. Neck:     Musculoskeletal: Normal range of motion and neck supple.     Thyroid: No thyroid mass, thyromegaly or thyroid tenderness.     Vascular: No carotid bruit.     Trachea: Trachea normal.  Cardiovascular:     Rate and Rhythm: Normal rate and regular rhythm.     Pulses:          Carotid pulses are 2+ on the right side and 2+ on the left side.      Radial pulses are 2+ on the right side and 2+ on the left side.       Posterior tibial pulses are 2+ on the right side and 1+ on the left side.     Heart sounds: Normal heart sounds.  Pulmonary:     Effort: Pulmonary effort is normal.     Breath sounds: Normal breath sounds.  Abdominal:     General: Abdomen is flat. Bowel sounds are normal.     Palpations: Abdomen is soft.  Musculoskeletal: Normal range of motion.     Right lower leg: No edema.     Left lower leg: No edema.  Lymphadenopathy:     Head:     Right side of head: No submental, submandibular, tonsillar, preauricular, posterior auricular or occipital adenopathy.     Left side of head: No submental, submandibular, tonsillar, preauricular, posterior auricular or occipital adenopathy.  Cervical: No cervical adenopathy.     Right cervical: No  superficial, deep or posterior cervical adenopathy.    Left cervical: No superficial, deep or posterior cervical adenopathy.     Upper Body:     Right upper body: No supraclavicular or epitrochlear adenopathy.     Left upper body: No supraclavicular or epitrochlear adenopathy.  Skin:    General: Skin is warm.     Capillary Refill: Capillary refill takes less than 2 seconds.     Nails: There is no clubbing.   Neurological:     General: No focal deficit present.     Mental Status: He is alert and oriented to person, place, and time.     GCS: GCS eye subscore is 4. GCS verbal subscore is 5. GCS motor subscore is 6.     Cranial Nerves: Cranial nerves are intact.     Sensory: Sensation is intact.     Motor: Motor function is intact.     Coordination: Coordination is intact. Romberg sign negative. Coordination normal. Finger-Nose-Finger Test and Heel to [facility] Test normal.     Gait: Gait is intact.  Psychiatric:        Attention and Perception: Attention and perception normal.        Mood and Affect: Mood and affect normal.        Speech: Speech normal.        Behavior: Behavior normal. Behavior is cooperative.        Thought Content: Thought content normal.        Cognition and Memory: Cognition and memory normal.        Judgment: Judgment normal.    MAEW 5/5 upper and lower extremity strength 5/5 equal grip strength       Assessment & Plan:  Encounter for other general examination Biometric screening  Pending labs: Lipid  Panel and glucose. Return to clinic as needed. Patient verbalizes understanding and has no questions at discharge.

## 2019-06-09 LAB — LIPID PANEL
Chol/HDL Ratio: 3 ratio (ref 0.0–5.0)
Cholesterol, Total: 215 mg/dL — ABNORMAL HIGH (ref 100–199)
HDL: 71 mg/dL (ref >39)
LDL Chol Calc (NIH): 117 mg/dL — ABNORMAL HIGH (ref 0–99)
Triglycerides: 159 mg/dL — ABNORMAL HIGH (ref 0–149)
VLDL Cholesterol Cal: 27 mg/dL (ref 5–40)

## 2019-06-09 LAB — GLUCOSE, RANDOM: Glucose: 88 mg/dL (ref 65–99)

## 2019-12-18 ENCOUNTER — Telehealth: Payer: Self-pay

## 2019-12-18 DIAGNOSIS — Z87891 Personal history of nicotine dependence: Secondary | ICD-10-CM

## 2019-12-18 DIAGNOSIS — Z122 Encounter for screening for malignant neoplasm of respiratory organs: Secondary | ICD-10-CM

## 2019-12-18 NOTE — Telephone Encounter (Signed)
Patient has been notified that the low dose lung cancer screening CT scan is due currently or will be in near future.  Confirmed that patient is within the appropriate age range and asymptomatic, (no signs or symptoms of lung cancer).  Patient denies illness that would prevent curative treatment for lung cancer if found.  Patient is agreeable for CT scan being scheduled.    Verified smoking history (current smoker, with 29 year 1.5 ppd history).   CT scan scheduled for 01/11/20 @ 3:30.

## 2019-12-19 NOTE — Telephone Encounter (Signed)
Smoking history: 01/11/20 at 330pm

## 2019-12-19 NOTE — Addendum Note (Signed)
Addended by: Jonne Ply on: 12/19/2019 01:35 PM   Modules accepted: Orders

## 2020-01-11 ENCOUNTER — Ambulatory Visit: Admission: RE | Admit: 2020-01-11 | Payer: Managed Care, Other (non HMO) | Source: Ambulatory Visit

## 2020-02-09 ENCOUNTER — Ambulatory Visit: Admission: RE | Admit: 2020-02-09 | Payer: Managed Care, Other (non HMO) | Source: Ambulatory Visit

## 2020-02-21 ENCOUNTER — Telehealth: Payer: Self-pay

## 2020-02-21 NOTE — Telephone Encounter (Addendum)
Patient has been notified that the low dose lung cancer screening CT scan is due currently or will be in near future.  Confirmed that patient is within the appropriate age range and asymptomatic, (no signs or symptoms of lung cancer).  Patient denies illness that would prevent curative treatment for lung cancer if found.  Patient is agreeable for CT scan being scheduled.    Verified smoking history (current smoker, with 28 year 1.25 ppd year history).   CT scheduled for 03/13/20 @ 11:45 (this is the best week for patient)

## 2020-03-13 ENCOUNTER — Ambulatory Visit: Payer: Managed Care, Other (non HMO) | Attending: Oncology

## 2020-05-15 ENCOUNTER — Other Ambulatory Visit: Payer: Self-pay | Admitting: Family Medicine

## 2020-06-24 ENCOUNTER — Other Ambulatory Visit: Payer: Self-pay | Admitting: Family Medicine

## 2020-06-30 ENCOUNTER — Telehealth: Payer: Self-pay

## 2020-06-30 NOTE — Telephone Encounter (Signed)
Patient contacted and scheduled for CT lung screening scan on Jul 25, 2020 at 2:45 pm at [facility]. Verified with patient smoking status. He is a current smoker and is smoking about 1 pack per day currently and has been a smoker for approximately 28 years. No major changes in medical condition since last year. No changes in insurance since last year.

## 2020-07-25 ENCOUNTER — Ambulatory Visit: Payer: Managed Care, Other (non HMO)

## 2020-07-31 NOTE — Telephone Encounter (Signed)
Patient has been rescheduled due to delay in obtaining insurance auth. Derek Fields has been received and patient is rescheduled.

## 2020-08-05 ENCOUNTER — Other Ambulatory Visit: Payer: Self-pay

## 2020-08-05 ENCOUNTER — Ambulatory Visit
Admission: RE | Admit: 2020-08-05 | Discharge: 2020-08-05 | Disposition: A | Discharge status: Home / Self Care | Payer: Managed Care, Other (non HMO) | Source: Ambulatory Visit | Attending: Oncology | Admitting: Oncology

## 2020-08-05 DIAGNOSIS — Z87891 Personal history of nicotine dependence: Secondary | ICD-10-CM

## 2020-08-05 DIAGNOSIS — Z122 Encounter for screening for malignant neoplasm of respiratory organs: Secondary | ICD-10-CM | POA: Diagnosis present

## 2020-08-07 ENCOUNTER — Encounter: Payer: Self-pay | Admitting: *Deleted

## 2021-01-08 ENCOUNTER — Other Ambulatory Visit: Payer: Self-pay | Admitting: Internal Medicine

## 2021-01-08 DIAGNOSIS — R1010 Upper abdominal pain, unspecified: Secondary | ICD-10-CM

## 2021-01-16 ENCOUNTER — Ambulatory Visit (HOSPITAL_COMMUNITY): Payer: Managed Care, Other (non HMO)

## 2021-01-16 ENCOUNTER — Encounter (HOSPITAL_COMMUNITY): Payer: Self-pay

## 2021-01-28 ENCOUNTER — Telehealth: Payer: Self-pay | Admitting: Cardiovascular Disease

## 2021-01-28 DIAGNOSIS — I10 Essential (primary) hypertension: Secondary | ICD-10-CM

## 2021-01-28 DIAGNOSIS — R0789 Other chest pain: Secondary | ICD-10-CM

## 2021-01-28 NOTE — Telephone Encounter (Signed)
Message received from Dr. Kirke Corin. Patient of Dr. Juliann Pares needs to be scheduled for a LHC on 02/05/21 with Dr. Kirke Corin.  Spoke with the patient. Pt given verbal pre-procedure instructions. Verified the patients current allergies and medications. Written instructions sent to the patient through mychart.  Patient verbalized understanding and has no questions or concerns at this time.       [facility] CARDIOVASCULAR DIVISION [facility] [address] [place] [address] [place] [address] Dept: [phone] Loc: [phone]  Derek Fields  01/28/2021  You are scheduled for a Cardiac Catheterization on Wednesday, June 29 with Dr. Lorine Bears.  1. Please arrive at the [facility] (Main Entrance A) at [facility]: [address] [place], [place] [address] at 8:30 AM (This time is two hours before your procedure to ensure your preparation). Free valet parking service is available.   Special note: Every effort is made to have your procedure done on time. Please understand that emergencies sometimes delay scheduled procedures.  2. Diet: Do not eat solid foods after midnight.  The patient may have clear liquids until 5am upon the day of the procedure.  3. Labs: You will need to have blood drawn on Thursday, June 23 at [facility], Go to 1st desk on your right to register.  Address: [address] [place], [place] [address]  Open: 8am - 5pm  Phone: [phone]. You do not need to be fasting.  4. Medication instructions in preparation for your procedure:   Contrast Allergy: No   On the morning of your procedure, take your Aspirin 81 mg and any morning medicines NOT listed above.  You may use sips of water.  5. Plan for one night stay--bring personal belongings. 6. Bring a current list of your medications and current insurance cards. 7. You MUST have a responsible person to drive you home. 8. Someone MUST be with you the  first 24 hours after you arrive home or your discharge will be delayed. 9. Please wear clothes that are easy to get on and off and wear slip-on shoes.  Thank you for allowing [place] to care for you!   -- [facility]

## 2021-01-31 ENCOUNTER — Other Ambulatory Visit
Admission: RE | Admit: 2021-01-31 | Discharge: 2021-01-31 | Disposition: A | Discharge status: Home / Self Care | Payer: Managed Care, Other (non HMO) | Source: Ambulatory Visit | Attending: Cardiovascular Disease | Admitting: Cardiovascular Disease

## 2021-01-31 DIAGNOSIS — I10 Essential (primary) hypertension: Secondary | ICD-10-CM | POA: Insufficient documentation

## 2021-01-31 DIAGNOSIS — R0789 Other chest pain: Secondary | ICD-10-CM | POA: Diagnosis present

## 2021-01-31 LAB — CBC WITH DIFFERENTIAL/PLATELET
Abs Immature Granulocytes: 0.02 10*3/uL (ref 0.00–0.07)
Basophils Absolute: 0.1 10*3/uL (ref 0.0–0.1)
Basophils Relative: 1 %
Eosinophils Absolute: 0.3 10*3/uL (ref 0.0–0.5)
Eosinophils Relative: 4 %
HCT: 39.8 % (ref 39.0–52.0)
Hemoglobin: 14.4 g/dL (ref 13.0–17.0)
Immature Granulocytes: 0 %
Lymphocytes Relative: 44 %
Lymphs Abs: 2.8 10*3/uL (ref 0.7–4.0)
MCH: 34.3 pg — ABNORMAL HIGH (ref 26.0–34.0)
MCHC: 36.2 g/dL — ABNORMAL HIGH (ref 30.0–36.0)
MCV: 94.8 fL (ref 80.0–100.0)
Monocytes Absolute: 0.6 10*3/uL (ref 0.1–1.0)
Monocytes Relative: 9 %
Neutro Abs: 2.6 10*3/uL (ref 1.7–7.7)
Neutrophils Relative %: 42 %
Platelets: 286 10*3/uL (ref 150–400)
RBC: 4.2 MIL/uL — ABNORMAL LOW (ref 4.22–5.81)
RDW: 13.2 % (ref 11.5–15.5)
WBC: 6.2 10*3/uL (ref 4.0–10.5)
nRBC: 0 % (ref 0.0–0.2)

## 2021-01-31 LAB — BASIC METABOLIC PANEL
Anion gap: 8 (ref 5–15)
BUN: 11 mg/dL (ref 6–20)
CO2: 28 mmol/L (ref 22–32)
Calcium: 9 mg/dL (ref 8.9–10.3)
Chloride: 103 mmol/L (ref 98–111)
Creatinine, Ser: 1.13 mg/dL (ref 0.61–1.24)
GFR, Estimated: >60 mL/min (ref >60)
Glucose, Bld: 104 mg/dL — ABNORMAL HIGH (ref 70–99)
Potassium: 4.2 mmol/L (ref 3.5–5.1)
Sodium: 139 mmol/L (ref 135–145)

## 2021-02-04 ENCOUNTER — Telehealth: Payer: Self-pay | Admitting: *Deleted

## 2021-02-04 NOTE — Telephone Encounter (Signed)
Pt contacted pre-catheterization scheduled at [facility] for: Wednesday February 05, 2021 10:30 AM Verified arrival time and place: [facility] Main Entrance A [facility]) at: 8:30 AM   No solid food after midnight prior to cath, clear liquids until 5 AM day of procedure.   AM meds can be  taken pre-cath with sips of water including: aspirin 81 mg   Confirmed patient has responsible adult to drive home post procedure and be with patient first 24 hours after arriving home: yes  You are allowed ONE visitor in the waiting room during the time you are at the hospital for your procedure. Both you and your visitor must wear a mask once you enter the hospital.   Patient reports does not currently have any symptoms concerning for COVID-19 and no household members with COVID-19 like illness.      Reviewed procedure/mask/visitor instructions with patient.

## 2021-02-05 ENCOUNTER — Encounter (HOSPITAL_COMMUNITY)
Admission: RE | Disposition: A | Discharge status: Home / Self Care | Payer: Self-pay | Source: Home / Self Care | Attending: Cardiovascular Disease

## 2021-02-05 ENCOUNTER — Ambulatory Visit (HOSPITAL_COMMUNITY)
Admission: RE | Admit: 2021-02-05 | Discharge: 2021-02-05 | Disposition: A | Discharge status: Home / Self Care | Payer: Managed Care, Other (non HMO) | Attending: Cardiovascular Disease | Admitting: Cardiovascular Disease

## 2021-02-05 ENCOUNTER — Other Ambulatory Visit: Payer: Self-pay

## 2021-02-05 DIAGNOSIS — J449 Chronic obstructive pulmonary disease, unspecified: Secondary | ICD-10-CM | POA: Insufficient documentation

## 2021-02-05 DIAGNOSIS — E7849 Other hyperlipidemia: Secondary | ICD-10-CM | POA: Diagnosis not present

## 2021-02-05 DIAGNOSIS — E781 Pure hyperglyceridemia: Secondary | ICD-10-CM | POA: Insufficient documentation

## 2021-02-05 DIAGNOSIS — R0609 Other forms of dyspnea: Secondary | ICD-10-CM | POA: Diagnosis not present

## 2021-02-05 DIAGNOSIS — I209 Angina pectoris, unspecified: Secondary | ICD-10-CM

## 2021-02-05 DIAGNOSIS — F17210 Nicotine dependence, cigarettes, uncomplicated: Secondary | ICD-10-CM | POA: Insufficient documentation

## 2021-02-05 DIAGNOSIS — K219 Gastro-esophageal reflux disease without esophagitis: Secondary | ICD-10-CM | POA: Diagnosis not present

## 2021-02-05 DIAGNOSIS — I10 Essential (primary) hypertension: Secondary | ICD-10-CM | POA: Insufficient documentation

## 2021-02-05 DIAGNOSIS — N400 Enlarged prostate without lower urinary tract symptoms: Secondary | ICD-10-CM | POA: Insufficient documentation

## 2021-02-05 DIAGNOSIS — Z79899 Other long term (current) drug therapy: Secondary | ICD-10-CM | POA: Diagnosis not present

## 2021-02-05 DIAGNOSIS — I25119 Atherosclerotic heart disease of native coronary artery with unspecified angina pectoris: Secondary | ICD-10-CM | POA: Insufficient documentation

## 2021-02-05 HISTORY — PX: LEFT HEART CATH AND CORONARY ANGIOGRAPHY: CATH118249

## 2021-02-05 SURGERY — LEFT HEART CATH AND CORONARY ANGIOGRAPHY
Anesthesia: LOCAL

## 2021-02-05 MED ORDER — ONDANSETRON HCL 4 MG/2ML IJ SOLN: 4.0000 mg | Freq: Four times a day (QID) | INTRAMUSCULAR | Status: DC | PRN

## 2021-02-05 MED ORDER — SODIUM CHLORIDE 0.9 % IV SOLN: 250.0000 mL | INTRAVENOUS | Status: DC | PRN

## 2021-02-05 MED ORDER — SODIUM CHLORIDE 0.9% FLUSH: 3.0000 mL | Freq: Two times a day (BID) | INTRAVENOUS | Status: DC

## 2021-02-05 MED ORDER — HEPARIN (PORCINE) IN NACL 1000-0.9 UT/500ML-% IV SOLN
INTRAVENOUS | Status: DC | PRN
  Administered 2021-02-05 (×2): 500 mL

## 2021-02-05 MED ORDER — IOHEXOL 350 MG/ML SOLN
INTRAVENOUS | Status: DC | PRN
Start: 2021-02-05 — End: 2021-02-05
  Administered 2021-02-05: 45 mL

## 2021-02-05 MED ORDER — FENTANYL CITRATE (PF) 100 MCG/2ML IJ SOLN
INTRAMUSCULAR | Status: DC | PRN
  Administered 2021-02-05: 50 ug via INTRAVENOUS

## 2021-02-05 MED ORDER — ASPIRIN 81 MG PO CHEW: 81.0000 mg | CHEWABLE_TABLET | ORAL | Status: DC

## 2021-02-05 MED ORDER — SODIUM CHLORIDE 0.9 % WEIGHT BASED INFUSION
3.0000 mL/kg/h | INTRAVENOUS | Status: AC
  Administered 2021-02-05: 3 mL/kg/h via INTRAVENOUS

## 2021-02-05 MED ORDER — VERAPAMIL HCL 2.5 MG/ML IV SOLN
INTRAVENOUS | Status: DC | PRN
  Administered 2021-02-05: 10 mL via INTRA_ARTERIAL

## 2021-02-05 MED ORDER — SODIUM CHLORIDE 0.9% FLUSH: 3.0000 mL | INTRAVENOUS | Status: DC | PRN

## 2021-02-05 MED ORDER — LIDOCAINE HCL (PF) 1 % IJ SOLN
INTRAMUSCULAR | Status: DC | PRN
Start: 2021-02-05 — End: 2021-02-05
  Administered 2021-02-05: 2 mL

## 2021-02-05 MED ORDER — SODIUM CHLORIDE 0.9 % IV SOLN: INTRAVENOUS | Status: DC

## 2021-02-05 MED ORDER — ACETAMINOPHEN 325 MG PO TABS: 650.0000 mg | ORAL_TABLET | ORAL | Status: DC | PRN

## 2021-02-05 MED ORDER — MIDAZOLAM HCL 2 MG/2ML IJ SOLN
INTRAMUSCULAR | Status: AC
  Filled 2021-02-05: qty 2

## 2021-02-05 MED ORDER — LIDOCAINE HCL (PF) 1 % IJ SOLN
INTRAMUSCULAR | Status: AC
  Filled 2021-02-05: qty 30

## 2021-02-05 MED ORDER — VERAPAMIL HCL 2.5 MG/ML IV SOLN
INTRAVENOUS | Status: AC
  Filled 2021-02-05: qty 2

## 2021-02-05 MED ORDER — HEPARIN SODIUM (PORCINE) 1000 UNIT/ML IJ SOLN
INTRAMUSCULAR | Status: AC
  Filled 2021-02-05: qty 1

## 2021-02-05 MED ORDER — FENTANYL CITRATE (PF) 100 MCG/2ML IJ SOLN
INTRAMUSCULAR | Status: AC
  Filled 2021-02-05: qty 2

## 2021-02-05 MED ORDER — MIDAZOLAM HCL 2 MG/2ML IJ SOLN
INTRAMUSCULAR | Status: DC | PRN
  Administered 2021-02-05: 1 mg via INTRAVENOUS

## 2021-02-05 MED ORDER — HEPARIN SODIUM (PORCINE) 1000 UNIT/ML IJ SOLN
INTRAMUSCULAR | Status: DC | PRN
  Administered 2021-02-05: 4000 [IU] via INTRAVENOUS

## 2021-02-05 MED ORDER — SODIUM CHLORIDE 0.9 % WEIGHT BASED INFUSION: 1.0000 mL/kg/h | INTRAVENOUS | Status: DC

## 2021-02-05 MED ORDER — HEPARIN (PORCINE) IN NACL 1000-0.9 UT/500ML-% IV SOLN
INTRAVENOUS | Status: AC
  Filled 2021-02-05: qty 1000

## 2021-02-05 SURGICAL SUPPLY — 11 items
CATH INFINITI 5 FR JL3.5 (CATHETERS) ×2 IMPLANT
CATH INFINITI 5FR JK (CATHETERS) ×2 IMPLANT
CATH INFINITI JR4 5F (CATHETERS) ×2 IMPLANT
DEVICE RAD COMP TR BAND LRG (VASCULAR PRODUCTS) ×2 IMPLANT
GLIDESHEATH SLEND SS 6F .021 (SHEATH) ×2 IMPLANT
KIT HEART LEFT (KITS) ×2 IMPLANT
PACK CARDIAC CATHETERIZATION (CUSTOM PROCEDURE TRAY) ×2 IMPLANT
TRANSDUCER W/STOPCOCK (MISCELLANEOUS) ×2 IMPLANT
TUBING CIL FLEX 10 FLL-RA (TUBING) ×2 IMPLANT
WIRE EMERALD 3MM-J .035X260CM (WIRE) ×2 IMPLANT
WIRE HI TORQ VERSACORE-J 145CM (WIRE) ×2 IMPLANT

## 2021-02-05 NOTE — H&P (Signed)
Derek Pile, Derek Fields - 01/21/2021 3:00 PM EDT Formatting of this note is different from the original. Established Patient Visit   Chief Complaint: Chief Complaint  Patient presents with   Shortness of Breath  1 year reck  Date of Service: 01/21/2021 Date of Birth: [birth year] PCP: Alan Mulder, MD  History of Present Illness: Derek Fields is a 60 y.o.male patient with PMH significant for hypertension, hyperlipidemia, BPH, COPD, headaches and tobacco use presents today for a 1 year routine follow-up visit.  Today the patient reports that over the past few months his symptoms of chest pain and dyspnea has returned. He describes the chest pain as an "tightness" in his chest and states "it feels as if it is a weight on me" and states that it feels heavy in the center of his chest and at times will radiate to the left shoulder blade. The patient reports that it is further aggravated by exertion and slightly improves with rest. He rates the pain at a 8 out of 10 on the numeric pain scale and states that the pain is occurring more frequent that it was previously. The patient also reports becoming easily dyspneic during exertion. The patient reports that he has been compliant with all of his current medications and admits that he continues to smoke 1 pack of cigarettes per day. He expresses concerns over the health of his heart due to his extensive family history of coronary artery disease.  Visit Summaries:  (11/16/2018)- office visit the patient presented with complaints of anginal pain. No medication changes were made at that time. An echocardiogram was ordered and the results were unremarkable.  (11/16/2019) - office visit the patient presented with complaints of chest pain and dyspnea. He also reported bilateral arm pain due to pravastatin therapy. At that time he was scheduled for noninvasive cardiac diagnostic testing, his pravastatin was discontinued and he was started on Crestor and  gemfibrozil.  (12/22/2019)-the patient was seen for follow-up from recent noninvasive cardiac diagnostic testing which was relatively unremarkable. At that time the patient has symptoms of chest pain and moderate dyspnea had completely resolved at that time. There were no changes made at that time.  Cardiovascular Diagnostic Studies:  Echocardiogram 2D complete: (12/2019)  INTERPRETATION NORMAL LEFT VENTRICULAR SYSTOLIC FUNCTION NORMAL RIGHT VENTRICULAR SYSTOLIC FUNCTION TRIVIAL REGURGITATION NOTED (See above) NO VALVULAR STENOSIS TRIVIAL MR, TR EF 55%  NM Myocardial Perfusion SPECT multiple (stress and rest): (12/2019) IMPRESSION: Normal myocardial perfusion scan no evidence of stress-induced myocardial ischemia ejection fraction of 60% conclusion negative scan  Past Medical and Surgical History  Past Medical History Past Medical History:  Diagnosis Date   Hyperlipidemia   Past Surgical History He has a past surgical history that includes Back surgery (2010).   Medications and Allergies  Current Medications  Current Outpatient Medications  Medication Sig Dispense Refill   albuterol 90 mcg/actuation inhaler Inhale 2 inhalations into the lungs every 6 (six) hours as needed for Wheezing 1 each 2   amLODIPine-valsartan (EXFORGE) 10-160 mg tablet TAKE 1 TABLET BY MOUTH EVERY DAY 90 tablet 0   esomeprazole (NEXIUM) 40 MG DR capsule 1 capsule twice a day 60 capsule 5   fluticasone propionate (FLONASE) 50 mcg/actuation nasal spray Place 2 sprays into both nostrils once daily 16 g 5   rosuvastatin (CRESTOR) 40 MG tablet TAKE 1 TABLET BY MOUTH EVERY DAY 30 tablet 0   gemfibroziL (LOPID) 600 mg tablet Take 1 tablet (600 mg total) by mouth 2 (two) times  daily before meals 60 tablet 11   nitroGLYcerin (NITROSTAT) 0.4 MG SL tablet Place 1 tablet (0.4 mg total) under the tongue every 5 (five) minutes as needed for Chest pain May take up to 3 doses. 25 tablet 11   No current  facility-administered medications for this visit.   Allergies: Patient has no known allergies.  Social and Family History  Social History reports that he has been smoking. He has been smoking about 1.50 packs per day. He has never used smokeless tobacco. He reports current alcohol use of about 1.0 - 2.0 standard drink of alcohol per week. He reports that he does not use drugs.  Family History Family History  Problem Relation Age of Onset   Heart failure Mother   Brain cancer Father   Ulcers Father   Review of Systems   Review of Systems: The patient reports intermittent chest pain and dyspnea. The patient denies orthopnea, paroxysmal nocturnal dyspnea, pedal edema, palpitations, heart racing, presyncope, syncope. Review of 12 Systems is negative except as described above.  Physical Examination   Vitals:BP 120/78  Pulse 68  Ht 165.1 cm (5\' 5" )  Wt 73.9 kg (163 lb)  SpO2 97%  BMI 27.12 kg/m  Ht:165.1 cm (5\' 5" ) Wt:73.9 kg (163 lb) surface area is 1.84 meters squared. Body mass index is 27.12 kg/m.  General: Alert and oriented. Well-appearing. No acute distress. HEENT: Pupils equally reactive to light and accomodation  Neck: Supple, carotid pulses 2+. No JVD Lungs: Normal effort of breathing; clear to auscultation bilaterally; no wheezes, rales, rhonchi or crackles. Chest expansion symmetrical Heart: Regular rate and rhythm. No murmur, rub, or gallop Abdomen: soft nontender, nondistended, with normal bowel sounds Extremities: no cyanosis, clubbing, or edema Peripheral Pulses: 2+ radial bilaterally Skin: Warm, dry, no diaphoresis, color appropriate for ethnicity Neurological: Cranial nerves II through XII grossly intact, 5/5 strength in all extremities  Assessment   60 y.o. male with  1. DOE (dyspnea on exertion)  2. Intermittent chest pain  3. Essential hypertension  4. Familial hypercholesterolemia  5. Hypertriglyceridemia, familial  6. COPD with chronic  bronchitis , unspecified (CMS-HCC)  7. Tobacco use  8. Gastroesophageal reflux disease, unspecified whether esophagitis present   Derek Fields is a 60 y.o.male patient with PMH significant for hypertension, hyperlipidemia, BPH, COPD, headaches and tobacco use presents today for a 1 year routine follow-up visit with complaints of return intermittent chest pain and dyspnea all with worsening. The patient's previous noninvasive cardiac diagnostic testing from 12/2019 was unremarkable; however, due to the patient's recurrent symptoms, extensive family history and current tobacco use further invasive strategies is recommended at this time. ECG on today reveals normal sinus rhythm and is without any ST changes. The patient appears fairly well today, his vital signs are stable he is in no apparent distress.  Plan  1. Dyspnea on exertion, recurrent, reasonably stable -previous echocardiogram from 12/2019 reveals normal LV systolic function with an EF estimated 55%. -We will consider pulmonary referral.  -Recommend tobacco cessation  2. Intermittent chest pain, recurrent, reasonably stable, concerning for possible coronary artery disease -Recommend cardiac catheterization at this time with possible PCI. The patient will like to have cardiac catheterization performed at [facility] in [place]. We will refer. -Previous Myoview from 12/2019 is a conclusive negative scan -We will continue aspirin therapy at this time. -We will prescribe nitroglycerin sublingual tablet as needed for severe chest pain. -The patient was thoroughly educated on the indications, side effects and how to take  this medication. He verbalized understanding of all information.  3. Hypertension, reasonably controlled, patient is normotensive on today -We will continue management with amlodipine/valsartan. -Recommend following DASH diet and monitoring blood pressure daily at home.   4. Hyperlipidemia, poorly controlled, likely due to  familial hyperlipidemia -Lipid profile reveals a total cholesterol = 243 (H), HDL = 82.1 (H), triglyceride = 496 (H), LDL cannot be calculated at that time due to the high triglyceride levels from 01/08/2021. -We will continue Crestor 40 mg once daily for lipid management with the addition of gemfibrozil for triglyceride management. -Consider Repatha therapy in place of statin. -Reinforced education to the patient on the importance of following a diet that is low in cholesterol and reporting any signs of arm pain or intolerance to the prescribed medications that he verbalized understanding of all information. -Recommend considering lipid clinic referral.  5. Hypertriglyceridemia, uncontrolled, likely familial in etiology -Triglyceride = 496 (H) from 01/2021.  -Recommend continuing gemfibrozil therapy.  6. COPD, stable -Management per PCP. -Recommend tobacco cessation.  7. Tobacco use, chronic, patient smokes about 1 pack of cigarettes daily -Recommend tobacco cessation. -Reinforced education to the patient on the use of nicotine patches and he verbalized understanding of all information.  8. GERD, chronic, stable -Recommend continuing Nexium therapy. -Recommend following up with GI. -Recommend reducing alcohol intake.  Return for post cardiac catheterization follow up. Discussed return precautions with patient for acute and chronic cardiovascular issues.  I personally performed the service, non-incident to. (WP)   Derek MARIA WHITE, Derek Fields   This dictation was prepared with dragon dictation. Any transcription errors that result from this process are unintentional.   Electronically signed by Derek Pile, Derek Fields at 01/22/2021 8:40 AM EDT   Addendum on 02/05/2021: The patient was referred for outpatient cardiac catheterization.  He has multiple risk factors for coronary artery disease including essential hypertension, hyperlipidemia and tobacco use.  He is not diabetic.  He recently  started having significant exertional dyspnea and chest tightness with exertion.  This has progressed to the point its happening after taking the trash out to the street.  This has significantly limited his ability to perform activities of daily living.  By physical exam, heart is regular with no murmurs.  Lungs are clear to auscultation with no significant leg edema.  His radial pulses are normal.  Recommendations The patient has progressive anginal symptoms consistent with class III angina in spite of medical therapy with amlodipine.  I agree with left heart catheterization possible PCI.  I discussed the procedure in details as well as risk and benefits.  Patient has been.

## 2021-02-06 ENCOUNTER — Encounter (HOSPITAL_COMMUNITY): Payer: Self-pay | Admitting: Cardiovascular Disease

## 2021-10-20 ENCOUNTER — Other Ambulatory Visit: Payer: Self-pay

## 2021-10-20 DIAGNOSIS — Z87891 Personal history of nicotine dependence: Secondary | ICD-10-CM

## 2021-10-20 DIAGNOSIS — F17210 Nicotine dependence, cigarettes, uncomplicated: Secondary | ICD-10-CM

## 2021-11-07 ENCOUNTER — Ambulatory Visit
Admission: RE | Admit: 2021-11-07 | Discharge: 2021-11-07 | Disposition: A | Discharge status: Home / Self Care | Payer: Managed Care, Other (non HMO) | Source: Ambulatory Visit | Attending: Acute Care | Admitting: Acute Care

## 2021-11-07 DIAGNOSIS — Z87891 Personal history of nicotine dependence: Secondary | ICD-10-CM

## 2021-11-07 DIAGNOSIS — F17210 Nicotine dependence, cigarettes, uncomplicated: Secondary | ICD-10-CM

## 2021-11-11 ENCOUNTER — Other Ambulatory Visit: Payer: Self-pay

## 2021-11-11 DIAGNOSIS — F17210 Nicotine dependence, cigarettes, uncomplicated: Secondary | ICD-10-CM

## 2021-11-11 DIAGNOSIS — Z87891 Personal history of nicotine dependence: Secondary | ICD-10-CM

## 2021-11-11 DIAGNOSIS — Z122 Encounter for screening for malignant neoplasm of respiratory organs: Secondary | ICD-10-CM

## 2021-11-19 ENCOUNTER — Encounter: Payer: Self-pay | Admitting: Emergency Medicine

## 2021-11-19 ENCOUNTER — Other Ambulatory Visit: Payer: Self-pay

## 2021-11-19 ENCOUNTER — Emergency Department
Admission: EM | Admit: 2021-11-19 | Discharge: 2021-11-19 | Disposition: A | Discharge status: Home / Self Care | Payer: Managed Care, Other (non HMO) | Attending: Emergency Medicine | Admitting: Emergency Medicine

## 2021-11-19 DIAGNOSIS — I10 Essential (primary) hypertension: Secondary | ICD-10-CM | POA: Diagnosis not present

## 2021-11-19 DIAGNOSIS — T7840XA Allergy, unspecified, initial encounter: Secondary | ICD-10-CM | POA: Diagnosis present

## 2021-11-19 DIAGNOSIS — T783XXA Angioneurotic edema, initial encounter: Secondary | ICD-10-CM | POA: Diagnosis not present

## 2021-11-19 DIAGNOSIS — J449 Chronic obstructive pulmonary disease, unspecified: Secondary | ICD-10-CM | POA: Diagnosis not present

## 2021-11-19 LAB — CBC WITH DIFFERENTIAL/PLATELET
Abs Immature Granulocytes: 0.02 10*3/uL (ref 0.00–0.07)
Basophils Absolute: 0.1 10*3/uL (ref 0.0–0.1)
Basophils Relative: 1 %
Eosinophils Absolute: 0.3 10*3/uL (ref 0.0–0.5)
Eosinophils Relative: 3 %
HCT: 46.5 % (ref 39.0–52.0)
Hemoglobin: 16 g/dL (ref 13.0–17.0)
Immature Granulocytes: 0 %
Lymphocytes Relative: 34 %
Lymphs Abs: 2.7 10*3/uL (ref 0.7–4.0)
MCH: 34.3 pg — ABNORMAL HIGH (ref 26.0–34.0)
MCHC: 34.4 g/dL (ref 30.0–36.0)
MCV: 99.6 fL (ref 80.0–100.0)
Monocytes Absolute: 1 10*3/uL (ref 0.1–1.0)
Monocytes Relative: 13 %
Neutro Abs: 3.8 10*3/uL (ref 1.7–7.7)
Neutrophils Relative %: 49 %
Platelets: 289 10*3/uL (ref 150–400)
RBC: 4.67 MIL/uL (ref 4.22–5.81)
RDW: 13.9 % (ref 11.5–15.5)
WBC: 7.8 10*3/uL (ref 4.0–10.5)
nRBC: 0 % (ref 0.0–0.2)

## 2021-11-19 LAB — BASIC METABOLIC PANEL
Anion gap: 10 (ref 5–15)
BUN: 14 mg/dL (ref 6–20)
CO2: 26 mmol/L (ref 22–32)
Calcium: 9.4 mg/dL (ref 8.9–10.3)
Chloride: 104 mmol/L (ref 98–111)
Creatinine, Ser: 1.26 mg/dL — ABNORMAL HIGH (ref 0.61–1.24)
GFR, Estimated: >60 mL/min (ref >60)
Glucose, Bld: 106 mg/dL — ABNORMAL HIGH (ref 70–99)
Potassium: 4.1 mmol/L (ref 3.5–5.1)
Sodium: 140 mmol/L (ref 135–145)

## 2021-11-19 MED ORDER — DEXAMETHASONE SODIUM PHOSPHATE 10 MG/ML IJ SOLN
10.0000 mg | Freq: Once | INTRAMUSCULAR | Status: AC
  Administered 2021-11-19: 10 mg via INTRAVENOUS
  Filled 2021-11-19: qty 1

## 2021-11-19 MED ORDER — AMLODIPINE BESYLATE 10 MG PO TABS: 10.0000 mg | ORAL_TABLET | Freq: Every day | ORAL | 11 refills | Status: DC

## 2021-11-19 MED ORDER — DIPHENHYDRAMINE HCL 50 MG/ML IJ SOLN
50.0000 mg | Freq: Once | INTRAMUSCULAR | Status: AC
  Administered 2021-11-19: 50 mg via INTRAVENOUS
  Filled 2021-11-19: qty 1

## 2021-11-19 NOTE — ED Provider Notes (Signed)
? ?[facility] ?Provider Note ? ? ? Event Date/Time  ? First MD Initiated Contact with Patient 11/19/21 [phone]   ?  (approximate) ? ? ?History  ? ?Allergic Reaction ? ? ?HPI ? ?Derek Fields is a 62 y.o. male  with PMH significant for hypertension, hyperlipidemia, BPH, COPD, headaches and tobacco abuse as well as previous episodes of tongue swelling who presents for evaluation of some tongue swelling and notes this morning when he woke up.  States he felt fine going to sleep last night without any tongue swelling.  States he is a little more hoarse and then his voice sounds different which he attributes to his tongue being swollen.  He denies any sore throat, difficulty swallowing, shortness of breath, cough, fever, abdominal pain, chest pain, back pain, nausea, vomiting rash or extremity pain.  No urinary symptoms.  He denies any new medications and is not on ACE.  Endorses tobacco abuse but denies illicit drug use or recent EtOH use.  States that this is about the third time this is happened and he has seen an allergist but they were never tell him what was causing it.  He denies any new foods, exposures or medications. ? ?  ? ? ?Physical Exam  ?Triage Vital Signs: ?ED Triage Vitals  ?Enc Vitals Group  ?   BP 11/19/21 0700 (!) 142/92  ?   Pulse Rate 11/19/21 0700 75  ?   Resp 11/19/21 0700 18  ?   Temp 11/19/21 0700 98.7 ?F (37.1 ?C)  ?   Temp Source 11/19/21 0700 Oral  ?   SpO2 11/19/21 0700 99 %  ?   Weight 11/19/21 0656 165 lb (74.8 kg)  ?   Height 11/19/21 0656 5\' 5"  (1.651 m)  ?   Head Circumference --   ?   Peak Flow --   ?   Pain Score 11/19/21 0656 0  ?   Pain Loc --   ?   Pain Edu? --   ?   Excl. in [place]? --   ? ? ?Most recent vital signs: ?Vitals:  ? 11/19/21 0700  ?BP: (!) 142/92  ?Pulse: 75  ?Resp: 18  ?Temp: 98.7 ?F (37.1 ?C)  ?SpO2: 99%  ? ? ?General: Awake, no distress.  ?CV:  Good peripheral perfusion.  2+ radial pulses.  No murmur. ?Resp:  Normal effort.  Clear  bilaterally. ?Abd:  No distention.  Soft. ?Other:  Left tongue slightly edematous and swollen compared to the right.  There is no stridor over the neck.  Posterior oropharynx tonsils and uvula are unremarkable.  Gingiva are unremarkable.  Cranial nerves II through XII are grossly intact.  No pronator drift or finger dysmetria.  Patient has symmetric strength in upper and lower extremities. ? ? ?ED Results / Procedures / Treatments  ?Labs ?(all labs ordered are listed, but only abnormal results are displayed) ?Labs Reviewed  ?CBC WITH DIFFERENTIAL/PLATELET - Abnormal; Notable for the following components:  ?    Result Value  ? MCH 34.3 (*)   ? All other components within normal limits  ?BASIC METABOLIC PANEL - Abnormal; Notable for the following components:  ? Glucose, Bld 106 (*)   ? Creatinine, Ser 1.26 (*)   ? All other components within normal limits  ?C1 ESTERASE INHIBITOR  ? ? ? ?EKG ? ? ? ? ?RADIOLOGY ? ? ? ?PROCEDURES: ? ?Critical Care performed: No ? ?.1-3 Lead EKG Interpretation ?Performed by: Lucrezia Starch, MD ?Authorized  by: Lucrezia Starch, MD  ? ?  Interpretation: normal   ?  ECG rate assessment: normal   ?  Rhythm: sinus rhythm   ?  Ectopy: none   ?  Conduction: normal   ? ?The patient is on the cardiac monitor to evaluate for evidence of arrhythmia and/or significant heart rate changes. ? ? ?MEDICATIONS ORDERED IN ED: ?Medications  ?diphenhydrAMINE (BENADRYL) injection 50 mg (50 mg Intravenous Given 11/19/21 0730)  ?dexamethasone (DECADRON) injection 10 mg (10 mg Intravenous Given 11/19/21 0730)  ? ? ? ?IMPRESSION / MDM / ASSESSMENT AND PLAN / ED COURSE  ?I reviewed the triage vital signs and the nursing notes. ?             ?               ? ?Differential diagnosis includes, but is not limited to angioedema secondary to allergic reaction i.e. histamine mediated, bradykinin mediated with her due to inherited or acquired esterase inhibitor deficiency with lower suspicion for ACE related excessive  bradykinin as patient is not currently on ACE inhibitor.  BMP without significant lecture metabolic derangements.  CBC without leukocytosis or acute anemia.  Overall presentation is not suggestive of trauma or acute infectious process.  Patient has very slight tongue swelling but otherwise no stridor or GI symptoms, skin symptoms or cardiovascular symptoms and overall Evalose patient for anaphylaxis. ? ?Of note patient is on combination of an ARB losartan with his amlodipine.  It is extremely rare for ARB to be associated with angioedema although there is no uncommon association.  Abundance of caution we will have patient discontinue this and only take amlodipine which will be described to take and follow-up with PCP to establish new blood pressure regimen. ? ?Patient given some steroids and Benadryl and was observed for 2 hours and during this time had significant treatment of his swelling of his tongue and normalization of his voice.  He states he is feeling much better.  After some further discussion he states he sometimes laughs and itching swelling of his hands and that this has been ongoing on and off over months but not any recent episodes last couple days.  He thinks the last time he had any itching was couple weeks ago.  Overall picture is concerning for possible hereditary or acquired C1 esterase deficiency.  We will have patient follow-up with PCP and allergist.  Discussed returning for any new or worsening symptoms.  Discharged in stable condition.  Strict return precautions advised and discussed.  Counseled on tobacco cessation. ? ?FINAL CLINICAL IMPRESSION(S) / ED DIAGNOSES  ? ?Final diagnoses:  ?Angioedema, initial encounter  ? ? ? ?Rx / DC Orders  ? ?ED Discharge Orders   ? ?      Ordered  ?  amLODipine (NORVASC) 10 MG tablet  Daily       ? 11/19/21 0835  ? ?  ?  ? ?  ? ? ? ?Note:  This document was prepared using Dragon voice recognition software and may include unintentional dictation errors. ?   ?Lucrezia Starch, MD ?11/19/21 [phone] ? ?

## 2021-11-19 NOTE — ED Triage Notes (Signed)
Patient ambulatory to triage with steady gait, without difficulty or distress noted; pt reports awoke with tongue swelling, unknown cause ?

## 2021-11-19 NOTE — Discharge Instructions (Addendum)
A test called C1 esterase was sent to emergency room today.  This mass was followed up by her PCP and allergist.  Please return immediately for any new or worsening of your symptoms.  As we discussed discontinue her combination blood pressure medicine and only take amlodipine and take and follow-up with your primary care doctor to figure out a new blood pressure regimen in the next 3 to 7 days. ?

## 2021-11-20 LAB — C1 ESTERASE INHIBITOR: C1INH SerPl-mCnc: 37 mg/dL (ref 21–39)

## 2022-01-13 ENCOUNTER — Ambulatory Visit: Payer: Managed Care, Other (non HMO) | Admitting: Pulmonary Disease

## 2022-01-13 ENCOUNTER — Telehealth: Payer: Self-pay | Admitting: Pharmacy Technician

## 2022-01-13 ENCOUNTER — Encounter: Payer: Self-pay | Admitting: Pulmonary Disease

## 2022-01-13 ENCOUNTER — Other Ambulatory Visit (HOSPITAL_COMMUNITY): Payer: Self-pay

## 2022-01-13 VITALS — BP 102/60 | HR 80 | Temp 98.0°F | Ht 65.0 in | Wt 165.4 lb

## 2022-01-13 DIAGNOSIS — R0683 Snoring: Secondary | ICD-10-CM

## 2022-01-13 DIAGNOSIS — J449 Chronic obstructive pulmonary disease, unspecified: Secondary | ICD-10-CM

## 2022-01-13 DIAGNOSIS — R0602 Shortness of breath: Secondary | ICD-10-CM

## 2022-01-13 DIAGNOSIS — F17210 Nicotine dependence, cigarettes, uncomplicated: Secondary | ICD-10-CM

## 2022-01-13 DIAGNOSIS — R1013 Epigastric pain: Secondary | ICD-10-CM | POA: Diagnosis not present

## 2022-01-13 MED ORDER — ALBUTEROL SULFATE HFA 108 (90 BASE) MCG/ACT IN AERS: 2.0000 | INHALATION_SPRAY | Freq: Four times a day (QID) | RESPIRATORY_TRACT | 3 refills | Status: AC | PRN

## 2022-01-13 MED ORDER — TRELEGY ELLIPTA 100-62.5-25 MCG/ACT IN AEPB: 1.0000 | INHALATION_SPRAY | Freq: Every day | RESPIRATORY_TRACT | 0 refills | Status: DC

## 2022-01-13 MED ORDER — TRELEGY ELLIPTA 100-62.5-25 MCG/ACT IN AEPB: 1.0000 | INHALATION_SPRAY | Freq: Every day | RESPIRATORY_TRACT | 11 refills | Status: DC

## 2022-01-13 NOTE — Patient Instructions (Addendum)
We are getting breathing tests to check on your shortness of breath.  We are getting a home sleep test.  We are giving you a trial of an inhaler called Trelegy Ellipta that is 1 puff daily make sure you rinse your mouth well after use it.  We will see you in follow-up in 2 to 3 months time call sooner should any new problems arise.  We will let you know the results of your tests as they become available

## 2022-01-13 NOTE — Telephone Encounter (Signed)
Patient Advocate Encounter  Received notification from [place] that prior authorization for ALBUTEROL INH is required.   PA submitted on 6.6.23 Key [id number] Status is pending   [facility] will continue to follow  Luciano Cutter, CPhT Patient Advocate Phone: [phone]

## 2022-01-13 NOTE — Progress Notes (Signed)
Subjective:    Patient ID: Derek Fields, male    DOB: 1961/06/15, 61 y.o.   MRN: [id number] Patient Care Team: Barbette Reichmann, MD as PCP - General (Internal Medicine)  Chief Complaint  Patient presents with   pulmonary consult    Self referral--CT 11/10/2021.  SOB with exertion, prod cough with green to brown sputum and wheezing.    HPI Patient is a 61 year old current smoker (1.5 PPD) who presents for evaluation of shortness of breath on exertion for approximately 4 to 6 months time.  He presents as a self-referral.  His primary physician is Dr. Barbette Reichmann.  He also notes that he has had crampy/colicky type of epigastric pain that has been present also for about an year.  He has been diagnosed with gastroesophageal reflux previously has had multiple EGDs and is on Nexium.  This does not alleviate this pain.  He has been evaluated by GI and has an upcoming appointment with GI as well.  Had cardiac catheterization in June 2022 for evaluation of the shortness of breath this showed minimal obstructive coronary disease with an RCA lesion of 20% and a first diagonal lesion at 30% LAD to mid LAD at 30% and EF of 55 to 65%.  No intervention necessary, medical management recommended.  Patient does describe some cough productive of brownish sputum at times.  No hemoptysis.  Cough is worse in the mornings.  Continues to smoke 1.5 packs of cigarettes per day.  His symptoms are not alleviated by anything in particular.  Patient's wife presents with him today and notes that he snores loudly and has apneic episodes during sleep.  She has been concerned he may have sleep apnea.  Patient works in Research scientist (life sciences) no exposures to asbestos or inorganic dusts.  He is a beer drinker.  Drinks approximately 14 beers a week.  Not on any inhalers.  Has never seen a pulmonologist.  Has not had PFTs.  He is enrolled in lung cancer screening program most recent CT performed 07 November 2021 which was a lung RADS 2 with  benign appearance or behavior.  Patient of note has hepatic steatosis.  Emphysema noted as well.   Review of Systems A 10 point review of systems was performed and it is as noted above otherwise negative.  Past Medical History:  Diagnosis Date   Arthritis    GERD (gastroesophageal reflux disease)    Hyperlipidemia    Hypertension    Past Surgical History:  Procedure Laterality Date   BLADDER SURGERY  1992   "trimmed up" bladder outlet   CERVICAL DISC SURGERY     LEFT HEART CATH AND CORONARY ANGIOGRAPHY N/A 02/05/2021   Procedure: LEFT HEART CATH AND CORONARY ANGIOGRAPHY;  Surgeon: Iran Ouch, MD;  Location: [facility];  Service: Cardiovascular;  Laterality: N/A;   NECK SURGERY  2010   cadaver bone inserted along with a plate for ddd   SHOULDER SURGERY  2013   right shoulder   TRANSURETHRAL RESECTION OF PROSTATE N/A 04/08/2018   Procedure: TRANSURETHRAL RESECTION OF THE PROSTATE (TURP);  Surgeon: Sondra Come, MD;  Location: [facility] ORS;  Service: Urology;  Laterality: N/A;  Bipolar   Patient Active Problem List   Diagnosis Date Noted   Angina pectoris (HCC)    Benign prostatic hyperplasia with weak urinary stream 02/07/2018   COPD with chronic bronchitis (HCC) 02/07/2018   GERD (gastroesophageal reflux disease) 01/20/2018   Umbilical hernia 10/07/2016   Essential hypertension 05/03/2015  Tobacco abuse 05/03/2015   BPH (benign prostatic hyperplasia) 05/03/2015   Mixed hyperlipidemia 05/03/2015   Headache 04/11/2014   Circumoral numbness 04/11/2014   Arm numbness 04/11/2014   Chest tightness 04/11/2014   Family History  Problem Relation Age of Onset   Heart disease Mother    Cancer Father        Brain cancer.   Prostate cancer Neg Hx    Bladder Cancer Neg Hx    Kidney cancer Neg Hx    Social History   Tobacco Use   Smoking status: Every Day    Packs/day: 2.00    Years: 30.00    Pack years: 60.00    Types: Cigarettes   Smokeless tobacco: Former     Types: Chew    Quit date: 2010   Tobacco comments:    1.5PPD 01/13/2022  Substance Use Topics   Alcohol use: Yes    Alcohol/week: 14.0 standard drinks    Types: 14 Cans of beer per week    Comment: depending if on call   No Known Allergies  Current Meds  Medication Sig   albuterol (VENTOLIN HFA) 108 (90 Base) MCG/ACT inhaler Inhale 2 puffs into the lungs every 6 (six) hours as needed.   amLODipine (NORVASC) 10 MG tablet Take 1 tablet (10 mg total) by mouth daily.   esomeprazole (NEXIUM) 40 MG capsule TAKE ONE CAPSULE BY MOUTH EVERY DAY   gemfibrozil (LOPID) 600 MG tablet Take 600 mg by mouth 2 (two) times daily before a meal.   nitroGLYCERIN (NITROSTAT) 0.4 MG SL tablet Place 0.4 mg under the tongue every 5 (five) minutes as needed for chest pain.   rosuvastatin (CRESTOR) 40 MG tablet Take 40 mg by mouth daily.   Immunization History  Administered Date(s) Administered   Influenza,inj,Quad PF,6+ Mos 05/03/2015   PFIZER(Purple Top)SARS-COV-2 Vaccination 08/16/2019, 09/06/2019   Tdap 05/03/2015       Objective:   Physical Exam BP 102/60 (BP Location: Left Arm, Cuff Size: Normal)   Pulse 80   Temp 98 F (36.7 C) (Temporal)   Ht 5\' 5"  (1.651 m)   Wt 165 lb 6.4 oz (75 kg)   SpO2 96%   BMI 27.52 kg/m  GENERAL: Rudy complected male, well-developed, well-nourished, no acute distress.  Fully ambulatory.  No conversational dyspnea. HEAD: Normocephalic, atraumatic.  EYES: Pupils equal, round, reactive to light.  No scleral icterus.  MOUTH: Oral mucosa moist. NECK: Supple. No thyromegaly. Trachea midline. No JVD.  No adenopathy. PULMONARY: Good air entry bilaterally.  No adventitious sounds. CARDIOVASCULAR: S1 and S2. Regular rate and rhythm.  No rubs, murmurs or gallops heard. ABDOMEN: Some nonspecific epigastric tenderness without rebound, patient states chronic, no distention. MUSCULOSKELETAL: No joint deformity, no clubbing, no edema.  NEUROLOGIC: No overt focal deficit, no  gait disturbance, speech is fluent. SKIN: Intact,warm,dry. PSYCH: Mood and behavior normal     01/13/2022    3:00 PM  Results of the Epworth flowsheet  Sitting and reading 0  Watching TV 0  Sitting, inactive in a public place (e.g. a theatre or a meeting) 0  As a passenger in a car for an hour without a break 0  Lying down to rest in the afternoon when circumstances permit 1  Sitting and talking to someone 0  Sitting quietly after a lunch without alcohol 0  In a car, while stopped for a few minutes in traffic 0  Total score 1      Assessment & Plan:  ICD-10-CM   1. COPD suggested by initial evaluation (HCC)  J44.9    PFTs Trial of Trelegy Ellipta 1 inhalation daily Albuterol as needed    2. SOB (shortness of breath)  R06.02 Pulmonary Function Test [facility] Only   Suspect multifactorial PFTs to help clarify    3. Loud snoring  R06.83 Home sleep test   Loud snoring plus apneic episodes during sleep, witnessed Home sleep study rule out OSA    4. Colicky epigastric pain  R10.13    Ongoing for approximately 6 months No significant coronary occlusion on cath of 2022 Query GB disease, GERD Patient on PPI, to see GI    5. Tobacco dependence due to cigarettes  F17.210    Patient counseled regards to discontinuation of smoking Patient enrolled in lung cancer screening program     Orders Placed This Encounter  Procedures   Pulmonary Function Test [facility] Only    Standing Status:   Future    Standing Expiration Date:   01/14/2023    Scheduling Instructions:     Next available.    Order Specific Question:   Full PFT: includes the following: basic spirometry, spirometry pre & post bronchodilator, diffusion capacity (DLCO), lung volumes    Answer:   Full PFT   Home sleep test    Standing Status:   Future    Standing Expiration Date:   01/14/2023    Order Specific Question:   Where should this test be performed:    Answer:   [place]   Meds ordered this encounter  Medications    Fluticasone-Umeclidin-Vilant (TRELEGY ELLIPTA) 100-62.5-25 MCG/ACT AEPB    Sig: Inhale 1 puff into the lungs daily.    Dispense:  28 each    Refill:  11   Fluticasone-Umeclidin-Vilant (TRELEGY ELLIPTA) 100-62.5-25 MCG/ACT AEPB    Sig: Inhale 1 puff into the lungs daily.    Dispense:  60 each    Refill:  0    Order Specific Question:   Lot Number?    Answer:   [id number]    Order Specific Question:   Expiration Date?    Answer:   05/11/2023    Order Specific Question:   Manufacturer?    Answer:   [employer]Smith[employer] [[id number]]    Order Specific Question:   Quantity    Answer:   1   We will see the patient in follow-up in 2 to 3 months time he is to call sooner should any new problems arise.  We will notify him of the results of his tests as these become available.  Gailen Shelter, MD Advanced Bronchoscopy PCCM [facility]    *This note was dictated using voice recognition software/Dragon.  Despite best efforts to proofread, errors can occur which can change the meaning. Any transcriptional errors that result from this process are unintentional and may not be fully corrected at the time of dictation.

## 2022-01-22 NOTE — Telephone Encounter (Signed)
Please see my prior note.  I have only seen this patient once.  I did not prescribe the medication initially.  He is having issues with his COPD which are being under investigation.  Albuterol is used as a RESCUE medication not as a maintenance.  Patient is being given a trial of Trelegy for his COPD again which is under investigation since PFTs are still pending.

## 2022-01-22 NOTE — Telephone Encounter (Signed)
[facility] Patient Advocate Encounter  Received notification that the request for prior authorization for Albuterol Sulfate HFA 108 (90 Base)MCG/ACT aerosol has been denied due to There is no documentation that the patient had a beneficial response to this medication. This drug requires supportive documentation for all answers including chart notes. This documentation was not included or was missing for some answers. Short-acting beta2-agonist inhalers are medically necessary when there is documentation of failure, contraindication, or intolerance to the following: albuterol sulfate inhalation aerosol (generics for ProAir and Proventil). Short-acting beta2-agonist inhalers are considered medically necessary for continued use when initial criteria are met and there is documentation of beneficial response.    

## 2022-02-18 ENCOUNTER — Telehealth: Payer: Self-pay | Admitting: Pulmonary Disease

## 2022-02-18 NOTE — Telephone Encounter (Signed)
I have spoke with Mr. Daversa and his appt has been changed to 02/24/22 at 4:00pm to pick up HST machine

## 2022-02-24 ENCOUNTER — Ambulatory Visit: Payer: Managed Care, Other (non HMO)

## 2022-02-24 DIAGNOSIS — G4733 Obstructive sleep apnea (adult) (pediatric): Secondary | ICD-10-CM | POA: Diagnosis not present

## 2022-02-24 DIAGNOSIS — R0683 Snoring: Secondary | ICD-10-CM

## 2022-02-26 DIAGNOSIS — G4733 Obstructive sleep apnea (adult) (pediatric): Secondary | ICD-10-CM | POA: Diagnosis not present

## 2022-02-27 ENCOUNTER — Telehealth: Payer: Self-pay | Admitting: Pulmonary Disease

## 2022-02-27 DIAGNOSIS — G4733 Obstructive sleep apnea (adult) (pediatric): Secondary | ICD-10-CM

## 2022-02-27 NOTE — Telephone Encounter (Signed)
The HST report has been scanned in Epic for you to review

## 2022-03-02 NOTE — Telephone Encounter (Signed)
Home sleep study shows moderate sleep apnea and significant desaturation during sleep.  Because of significant concomitant heart disease and the significant desaturations during sleep recommend CPAP titration study.

## 2022-03-03 NOTE — Telephone Encounter (Signed)
Patient is aware of results and voiced his understanding.  Titration has been ordered. Nothing further needed.

## 2022-03-03 NOTE — Telephone Encounter (Signed)
Derek Fields can you place the order for me

## 2022-03-17 ENCOUNTER — Ambulatory Visit: Payer: Managed Care, Other (non HMO) | Attending: Pulmonary Disease

## 2022-03-17 DIAGNOSIS — R0602 Shortness of breath: Secondary | ICD-10-CM | POA: Diagnosis not present

## 2022-03-17 DIAGNOSIS — J449 Chronic obstructive pulmonary disease, unspecified: Secondary | ICD-10-CM | POA: Diagnosis not present

## 2022-03-17 LAB — PULMONARY FUNCTION TEST ARMC ONLY
DL/VA % pred: 82 %
DL/VA: 3.62 ml/min/mmHg/L
DLCO unc % pred: 72 %
DLCO unc: 17.35 ml/min/mmHg
FEF 25-75 Post: 2.91 L/sec
FEF 25-75 Pre: 2.34 L/sec
FEF2575-%Change-Post: 24 %
FEF2575-%Pred-Post: 110 %
FEF2575-%Pred-Pre: 89 %
FEV1-%Change-Post: 5 %
FEV1-%Pred-Post: 82 %
FEV1-%Pred-Pre: 78 %
FEV1-Post: 2.53 L
FEV1-Pre: 2.39 L
FEV1FVC-%Change-Post: 0 %
FEV1FVC-%Pred-Pre: 104 %
FEV6-%Change-Post: 5 %
FEV6-%Pred-Post: 82 %
FEV6-%Pred-Pre: 78 %
FEV6-Post: 3.16 L
FEV6-Pre: 3 L
FEV6FVC-%Pred-Post: 104 %
FEV6FVC-%Pred-Pre: 104 %
FVC-%Change-Post: 5 %
FVC-%Pred-Post: 78 %
FVC-%Pred-Pre: 74 %
FVC-Post: 3.16 L
FVC-Pre: 3 L
Post FEV1/FVC ratio: 80 %
Post FEV6/FVC ratio: 100 %
Pre FEV1/FVC ratio: 80 %
Pre FEV6/FVC Ratio: 100 %
RV % pred: 91 %
RV: 1.76 L
TLC % pred: 80 %
TLC: 4.83 L

## 2022-03-17 MED ORDER — ALBUTEROL SULFATE (2.5 MG/3ML) 0.083% IN NEBU
2.5000 mg | INHALATION_SOLUTION | Freq: Once | RESPIRATORY_TRACT | Status: AC
Start: 2022-03-17 — End: 2022-03-17
  Administered 2022-03-17: 2.5 mg via RESPIRATORY_TRACT
  Filled 2022-03-17: qty 3

## 2022-03-31 ENCOUNTER — Other Ambulatory Visit: Payer: Self-pay | Admitting: Internal Medicine

## 2022-03-31 ENCOUNTER — Other Ambulatory Visit (HOSPITAL_COMMUNITY): Payer: Self-pay | Admitting: Internal Medicine

## 2022-03-31 DIAGNOSIS — R1033 Periumbilical pain: Secondary | ICD-10-CM

## 2022-03-31 DIAGNOSIS — R140 Abdominal distension (gaseous): Secondary | ICD-10-CM

## 2022-04-01 ENCOUNTER — Ambulatory Visit: Payer: Managed Care, Other (non HMO) | Admitting: Pulmonary Disease

## 2022-04-01 ENCOUNTER — Encounter: Payer: Self-pay | Admitting: Pulmonary Disease

## 2022-04-01 VITALS — BP 118/78 | HR 76 | Temp 98.1°F | Ht 65.0 in | Wt 166.0 lb

## 2022-04-01 DIAGNOSIS — F17210 Nicotine dependence, cigarettes, uncomplicated: Secondary | ICD-10-CM | POA: Diagnosis not present

## 2022-04-01 DIAGNOSIS — G4733 Obstructive sleep apnea (adult) (pediatric): Secondary | ICD-10-CM

## 2022-04-01 DIAGNOSIS — J449 Chronic obstructive pulmonary disease, unspecified: Secondary | ICD-10-CM

## 2022-04-01 NOTE — Patient Instructions (Signed)
We will let you know as soon as we know from the titration study what type of equipment you need for your sleep apnea.  Continue using your Trelegy make sure you use it daily I recommend that you use it in the morning before you brush your teeth this way can remember to rinse your mouth afterwards.  We will see you in follow-up in 4 months time call sooner should any new problems arise.

## 2022-04-01 NOTE — Progress Notes (Signed)
Subjective:    Patient ID: Derek Fields, male    DOB: [birth year], 61 y.o.   MRN: [id number] Patient Care Team: Barbette Reichmann, MD as PCP - General (Internal Medicine)  Chief Complaint  Patient presents with   Follow-up    Breathing has improved some since the last visit. He admits to not using trelegy regularly. He has occ cough with green sputum. He has not used his albuterol inhaler.    HPI Patient is a 61 year old current smoker (1.5 PPD) presents for follow-up on the issue of dyspnea on exertion.  He was initially evaluated here on 13 January 2022.  Since that visit he has had a sleep study which shows that he has moderate sleep apnea, titration study is pending.  He did have oxygen desaturations as low as 73% during apneic episodes nocturnally.  He also had PFTs which are consistent with stage I COPD.  During his initial visit he was darted on Trelegy Ellipta.  He notes that this medication helps him tremendously however, he only uses it as needed.  He states that he forgets sometimes.  He does endorse having "brain fog"during today, excessive tiredness.  He notes nonrestorative sleep.  We discussed that these are symptoms of sleep apnea.  As noted he continues to smoke 1-1/2 packs of cigarettes per day.  We counseled the patient extensively today and discussed impact of cigarette smoking on pulmonary and cardiovascular systems.  He does not note any new symptoms.  As noted he has been doing well with the Trelegy Ellipta.   DATA 02/24/2022 sleep study: Moderate sleep apnea with AHI of 16 however O2 saturations as low as 73%, patient scheduled for titration study 03/17/2022 PFTs: FEV1 2.39 L or 78% predicted, FVC 3.0 L or 74% predicted, FEV1/FVC 80%.  Lung volumes normal diffusion capacity mildly reduced, there was increase in airway resistance.  No significant response to bronchodilator.  Mild obstructive defect with diffusion capacity impairment suggestive of emphysema   Review of  Systems A 10 point review of systems was performed and it is as noted above otherwise negative.  Patient Active Problem List   Diagnosis Date Noted   Angina pectoris (HCC)    Benign prostatic hyperplasia with weak urinary stream 02/07/2018   COPD with chronic bronchitis (HCC) 02/07/2018   GERD (gastroesophageal reflux disease) 01/20/2018   Umbilical hernia 10/07/2016   Essential hypertension 05/03/2015   Tobacco abuse 05/03/2015   BPH (benign prostatic hyperplasia) 05/03/2015   Mixed hyperlipidemia 05/03/2015   Headache 04/11/2014   Circumoral numbness 04/11/2014   Arm numbness 04/11/2014   Chest tightness 04/11/2014   Social History   Tobacco Use   Smoking status: Every Day    Packs/day: 2.00    Years: 30.00    Total pack years: 60.00    Types: Cigarettes   Smokeless tobacco: Former    Types: Chew    Quit date: 2010   Tobacco comments:    1.5PPD 01/13/2022  Substance Use Topics   Alcohol use: Yes    Alcohol/week: 14.0 standard drinks of alcohol    Types: 14 Cans of beer per week    Comment: depending if on call   No Known Allergies  Current Meds  Medication Sig   albuterol (VENTOLIN HFA) 108 (90 Base) MCG/ACT inhaler Inhale 2 puffs into the lungs every 6 (six) hours as needed for wheezing or shortness of breath.   amLODipine-valsartan (EXFORGE) 10-160 MG tablet Take 1 tablet by mouth daily.   esomeprazole (NEXIUM)  40 MG capsule TAKE ONE CAPSULE BY MOUTH EVERY DAY   Fluticasone-Umeclidin-Vilant (TRELEGY ELLIPTA) 100-62.5-25 MCG/ACT AEPB Inhale 1 puff into the lungs daily.   gemfibrozil (LOPID) 600 MG tablet Take 600 mg by mouth 2 (two) times daily before a meal.   nitroGLYCERIN (NITROSTAT) 0.4 MG SL tablet Place 0.4 mg under the tongue every 5 (five) minutes as needed for chest pain.   rosuvastatin (CRESTOR) 40 MG tablet Take 40 mg by mouth daily.   Immunization History  Administered Date(s) Administered   Influenza,inj,Quad PF,6+ Mos 05/03/2015   PFIZER(Purple  Top)SARS-COV-2 Vaccination 08/16/2019, 09/06/2019   Tdap 05/03/2015      Objective:   Physical Exam BP 118/78 (BP Location: Left Arm, Cuff Size: Normal)   Pulse 76   Temp 98.1 F (36.7 C) (Oral)   Ht 5\' 5"  (1.651 m)   Wt 166 lb (75.3 kg)   SpO2 96% Comment: on RA  BMI 27.62 kg/m  GENERAL: Rudy complected male, well-developed, well-nourished, no acute distress.  Fully ambulatory.  No conversational dyspnea. HEAD: Normocephalic, atraumatic.  EYES: Pupils equal, round, reactive to light.  No scleral icterus.  MOUTH: Oral mucosa moist. NECK: Supple. No thyromegaly. Trachea midline. No JVD.  No adenopathy. PULMONARY: Good air entry bilaterally.  No adventitious sounds. CARDIOVASCULAR: S1 and S2. Regular rate and rhythm.  No rubs, murmurs or gallops heard. ABDOMEN: Some nonspecific epigastric tenderness without rebound, patient states chronic, no distention. MUSCULOSKELETAL: No joint deformity, no clubbing, no edema.  NEUROLOGIC: No overt focal deficit, no gait disturbance, speech is fluent. SKIN: Intact,warm,dry. PSYCH: Mood and behavior normal       Assessment & Plan:     ICD-10-CM   1. Stage 1 mild COPD by GOLD classification (HCC)  J44.9    Reminded proper use of Trelegy, use daily not as needed Counseled regards discontinuation of smoking Continue as needed albuterol     2. OSA (obstructive sleep apnea)  G47.33    Awaiting titration study Study to be performed 8/30    3. Tobacco dependence due to cigarettes  F17.210    Patient counseled regards to discontinuation of smoking Total counseling time 3 to 5 minutes     Will see the patient in follow-up in 4 months time he is to contact [phone] prior to that time should any new difficulties arise.   [place], MD [facility]    *This note was dictated using voice recognition software/Dragon.  Despite best efforts to proofread, errors can occur which can change the  meaning. Any transcriptional errors that result from this process are unintentional and may not be fully corrected at the time of dictation.

## 2022-04-06 ENCOUNTER — Ambulatory Visit: Payer: Managed Care, Other (non HMO)

## 2022-04-08 ENCOUNTER — Ambulatory Visit: Payer: Managed Care, Other (non HMO) | Attending: Pulmonary Disease

## 2022-04-08 DIAGNOSIS — G4733 Obstructive sleep apnea (adult) (pediatric): Secondary | ICD-10-CM | POA: Diagnosis present

## 2022-04-16 ENCOUNTER — Telehealth (HOSPITAL_BASED_OUTPATIENT_CLINIC_OR_DEPARTMENT_OTHER): Payer: Managed Care, Other (non HMO) | Admitting: Pulmonary Disease

## 2022-04-16 DIAGNOSIS — G4733 Obstructive sleep apnea (adult) (pediatric): Secondary | ICD-10-CM | POA: Diagnosis not present

## 2022-04-16 NOTE — Telephone Encounter (Signed)
Trial of CPAP 14 cm If does not tolerate, can switch to BiPAP 14/10 with medium nasal mask O2 not required

## 2022-04-17 ENCOUNTER — Telehealth: Payer: Self-pay | Admitting: Pulmonary Disease

## 2022-04-17 NOTE — Telephone Encounter (Signed)
Spoke to patient.  He is requesting cpap titration results.  Dr. Jayme Cloud, please advise. Thanks

## 2022-04-20 NOTE — Telephone Encounter (Signed)
Patient is aware of results and voiced his understanding.  He would like to proceed with bipap. Order has been placed. Patient will call back to schedule OV once setup on machine.  Nothing further needed.

## 2022-04-20 NOTE — Telephone Encounter (Signed)
Titration study results per Dr. Vassie Loll: Trial of CPAP 14 cm If does not tolerate, can switch to BiPAP 14/10 with medium nasal mask O2 not required   Given high CPAP pressures I recommend BiPAP 14/10 with medium nasal mask.

## 2022-04-20 NOTE — Telephone Encounter (Signed)
Please see separate note sent.  Those results came available late 8/8.

## 2022-04-20 NOTE — Telephone Encounter (Signed)
Noted  

## 2022-04-20 NOTE — Addendum Note (Signed)
Addended by: Lajoyce Lauber A on: 04/20/2022 09:44 AM   Modules accepted: Orders

## 2022-05-04 ENCOUNTER — Ambulatory Visit
Admission: RE | Admit: 2022-05-04 | Discharge: 2022-05-04 | Disposition: A | Discharge status: Home / Self Care | Payer: Managed Care, Other (non HMO) | Source: Ambulatory Visit | Attending: Internal Medicine | Admitting: Internal Medicine

## 2022-05-04 DIAGNOSIS — R140 Abdominal distension (gaseous): Secondary | ICD-10-CM | POA: Diagnosis not present

## 2022-05-04 DIAGNOSIS — R1033 Periumbilical pain: Secondary | ICD-10-CM | POA: Insufficient documentation

## 2022-11-09 ENCOUNTER — Inpatient Hospital Stay: Admission: RE | Admit: 2022-11-09 | Payer: Managed Care, Other (non HMO) | Source: Ambulatory Visit

## 2022-11-09 ENCOUNTER — Inpatient Hospital Stay
Admission: RE | Admit: 2022-11-09 | Discharge: 2022-11-09 | Disposition: A | Discharge status: Home / Self Care | Payer: Managed Care, Other (non HMO) | Source: Ambulatory Visit

## 2022-11-11 ENCOUNTER — Ambulatory Visit
Admission: RE | Admit: 2022-11-11 | Discharge: 2022-11-11 | Disposition: A | Discharge status: Home / Self Care | Payer: Managed Care, Other (non HMO) | Source: Ambulatory Visit | Attending: Acute Care | Admitting: Acute Care

## 2022-11-11 DIAGNOSIS — Z122 Encounter for screening for malignant neoplasm of respiratory organs: Secondary | ICD-10-CM

## 2022-11-11 DIAGNOSIS — Z87891 Personal history of nicotine dependence: Secondary | ICD-10-CM

## 2022-11-11 DIAGNOSIS — F17210 Nicotine dependence, cigarettes, uncomplicated: Secondary | ICD-10-CM

## 2022-11-12 ENCOUNTER — Other Ambulatory Visit: Payer: Self-pay

## 2022-11-12 DIAGNOSIS — Z87891 Personal history of nicotine dependence: Secondary | ICD-10-CM

## 2022-11-12 DIAGNOSIS — Z122 Encounter for screening for malignant neoplasm of respiratory organs: Secondary | ICD-10-CM

## 2022-11-12 DIAGNOSIS — F17210 Nicotine dependence, cigarettes, uncomplicated: Secondary | ICD-10-CM

## 2023-02-03 ENCOUNTER — Other Ambulatory Visit: Payer: Self-pay | Admitting: Pulmonary Disease

## 2023-07-17 ENCOUNTER — Other Ambulatory Visit: Payer: Self-pay

## 2023-07-17 ENCOUNTER — Emergency Department
Admission: EM | Admit: 2023-07-17 | Discharge: 2023-07-17 | Disposition: A | Discharge status: Home / Self Care | Payer: Managed Care, Other (non HMO) | Attending: Emergency Medicine | Admitting: Emergency Medicine

## 2023-07-17 DIAGNOSIS — H1031 Unspecified acute conjunctivitis, right eye: Secondary | ICD-10-CM | POA: Insufficient documentation

## 2023-07-17 DIAGNOSIS — R519 Headache, unspecified: Secondary | ICD-10-CM | POA: Diagnosis present

## 2023-07-17 LAB — BASIC METABOLIC PANEL
Anion gap: 11 (ref 5–15)
BUN: 8 mg/dL (ref 6–20)
CO2: 26 mmol/L (ref 22–32)
Calcium: 8.9 mg/dL (ref 8.9–10.3)
Chloride: 100 mmol/L (ref 98–111)
Creatinine, Ser: 1.09 mg/dL (ref 0.61–1.24)
GFR, Estimated: >60 mL/min (ref >60)
Glucose, Bld: 89 mg/dL (ref 70–99)
Potassium: 4.1 mmol/L (ref 3.5–5.1)
Sodium: 137 mmol/L (ref 135–145)

## 2023-07-17 LAB — CBC WITH DIFFERENTIAL/PLATELET
Abs Immature Granulocytes: 0.05 10*3/uL (ref 0.00–0.07)
Basophils Absolute: 0.1 10*3/uL (ref 0.0–0.1)
Basophils Relative: 1 %
Eosinophils Absolute: 0.2 10*3/uL (ref 0.0–0.5)
Eosinophils Relative: 2 %
HCT: 45.4 % (ref 39.0–52.0)
Hemoglobin: 16.1 g/dL (ref 13.0–17.0)
Immature Granulocytes: 1 %
Lymphocytes Relative: 25 %
Lymphs Abs: 2.3 10*3/uL (ref 0.7–4.0)
MCH: 34.5 pg — ABNORMAL HIGH (ref 26.0–34.0)
MCHC: 35.5 g/dL (ref 30.0–36.0)
MCV: 97.4 fL (ref 80.0–100.0)
Monocytes Absolute: 0.6 10*3/uL (ref 0.1–1.0)
Monocytes Relative: 7 %
Neutro Abs: 6.2 10*3/uL (ref 1.7–7.7)
Neutrophils Relative %: 64 %
Platelets: 297 10*3/uL (ref 150–400)
RBC: 4.66 MIL/uL (ref 4.22–5.81)
RDW: 12.7 % (ref 11.5–15.5)
WBC: 9.4 10*3/uL (ref 4.0–10.5)
nRBC: 0 % (ref 0.0–0.2)

## 2023-07-17 LAB — SEDIMENTATION RATE: Sed Rate: 5 mm/h (ref 0–20)

## 2023-07-17 MED ORDER — PROPARACAINE HCL 0.5 % OP SOLN
2.0000 [drp] | Freq: Once | OPHTHALMIC | Status: AC
  Administered 2023-07-17: 2 [drp] via OPHTHALMIC
  Filled 2023-07-17: qty 15

## 2023-07-17 MED ORDER — FLUORESCEIN SODIUM 1 MG OP STRP
1.0000 | ORAL_STRIP | Freq: Once | OPHTHALMIC | Status: AC
  Administered 2023-07-17: 1 via OPHTHALMIC
  Filled 2023-07-17: qty 1

## 2023-07-17 MED ORDER — SULFAMETHOXAZOLE-TRIMETHOPRIM 800-160 MG PO TABS
1.0000 | ORAL_TABLET | Freq: Two times a day (BID) | ORAL | 0 refills | Status: AC
Start: 2023-07-17 — End: 2023-07-24

## 2023-07-17 NOTE — Discharge Instructions (Addendum)
Please follow-up with your eye doctor in several days for reassessment.  Please pick up your antibiotics from the pharmacy and take as prescribed.  Please continue taking the eyedrops that your other provider gave you.  Please return if you notice any significant swelling around the eye.  You can also take ibuprofen and Tylenol for pain symptoms.

## 2023-07-17 NOTE — ED Provider Notes (Signed)
[facility] Provider Note    Event Date/Time   First MD Initiated Contact with Patient 07/17/23 [id number]     (approximate)   History   Headache   HPI Derek Fields is a 62 y.o. male presenting today for headache.  Patient states 2 days ago waking up with right eye pain.  Noticed redness and drainage from the eye.  Was seen at the [facility] clinic where they prescribed him eyedrops to treat infection.  States overnight he developed worsening headache on the right side of his head.  Denies any vision loss, fevers, nausea, vomiting, ear pain, jaw pain.  Patient does state that his eye doctor in the past has told him he is "developing giant cell arteritis".  He is not currently on any treatment for it.     Physical Exam   Triage Vital Signs: ED Triage Vitals  Encounter Vitals Group     BP 07/17/23 0748 120/85     Systolic BP Percentile --      Diastolic BP Percentile --      Pulse Rate 07/17/23 0748 82     Resp 07/17/23 0748 17     Temp 07/17/23 0748 98.4 F (36.9 C)     Temp src --      SpO2 07/17/23 0748 95 %     Weight 07/17/23 0724 158 lb (71.7 kg)     Height 07/17/23 0724 5\' 5"  (1.651 m)     Head Circumference --      Peak Flow --      Pain Score 07/17/23 0724 9     Pain Loc --      Pain Education --      Exclude from Growth Chart --     Most recent vital signs: Vitals:   07/17/23 0748  BP: 120/85  Pulse: 82  Resp: 17  Temp: 98.4 F (36.9 C)  SpO2: 95%   I have reviewed the vital signs. General:  Awake, alert, no acute distress. Head:  Normocephalic, Atraumatic. EENT:  PERRL, EOMI with no restriction, conjunctival injection of the right eye, vision 20/50 right eye (no contact) and 20/25 left eye (contact) and 20/150 without contact which is reported baseline of left worse than right, fluorescein stain without evidence of ulceration or abrasion, Seidel test negative, Oral mucosa pink and moist, Neck is supple. Cardiovascular: Regular rate, 2+  distal pulses. Respiratory:  Normal respiratory effort, symmetrical expansion, no distress.   Extremities:  Moving all four extremities through full ROM without pain.   Neuro:  Alert and oriented.  Interacting appropriately.   Skin:  Warm, dry, no rash.   Psych: Appropriate affect.    ED Results / Procedures / Treatments   Labs (all labs ordered are listed, but only abnormal results are displayed) Labs Reviewed  CBC WITH DIFFERENTIAL/PLATELET - Abnormal; Notable for the following components:      Result Value   MCH 34.5 (*)    All other components within normal limits  BASIC METABOLIC PANEL  SEDIMENTATION RATE     EKG    RADIOLOGY    PROCEDURES:  Critical Care performed: No  Procedures   MEDICATIONS ORDERED IN ED: Medications  proparacaine (ALCAINE) 0.5 % ophthalmic solution 2 drop (has no administration in time range)  fluorescein ophthalmic strip 1 strip (has no administration in time range)     IMPRESSION / MDM / ASSESSMENT AND PLAN / ED COURSE  I reviewed the triage vital signs and the nursing  notes.                              Differential diagnosis includes, but is not limited to, conjunctivitis, acute angle-closure glaucoma, giant cell arteritis, corneal abrasion, corneal ulcer, iritis, uveitis  Patient's presentation is most consistent with acute complicated illness / injury requiring diagnostic workup.  Patient is a 62 year old male presenting today for right eye pain and redness.  Notable conjunctival injection present.  Vision equal and intact bilaterally.  Pupils equally round reflective to light.  No restrictions with full range of extraocular motions with lower suspicion for orbital cellulitis with also no surrounding edema.  Will get laboratory workup along with ESR to evaluate for potential giant cell arteritis.  Will also evaluate with tonometry and staining of the right eye.  No leukocytosis and ESR not elevated with lower suspicion for giant  cell arteritis.  Patient's vision is consistent with his baseline with left worse than right.  Extraocular pressure showed no evidence of acute angle-closure glaucoma.  Fluorescein stain negative for ulcer, abscess, or traumatic injury.  Patient was reassessed with minimal pain complaints.  He does not have any swelling around the eye or restriction of extraocular motion to suggest orbital cellulitis at this time.  Vision otherwise appears consistent with his baseline.  He is already on eyedrops for conjunctivitis and recommended short course of oral antibiotics as well and told to follow-up with his ophthalmologist.  Patient agreeable with plan and given strict return precautions for any worsening swelling.  Clinical Course as of 07/17/23 1146  Sat Jul 17, 2023  0919 Pressures: Right eye: 11, 9 Left eye: 14, 11  Fluorescein stain negative for ulcer or abrasion [DW]  1134 ESR negative.  Low suspicion for temporal cell arteritis at this time.  Vision is at his baseline.  No swelling periorbitally.  Will discharge with antibiotics [DW]    Clinical Course User Index [DW] Janith Lima, MD     FINAL CLINICAL IMPRESSION(S) / ED DIAGNOSES   Final diagnoses:  Acute bacterial conjunctivitis of right eye     Rx / DC Orders   ED Discharge Orders          Ordered    sulfamethoxazole-trimethoprim (BACTRIM DS) 800-160 MG tablet  2 times daily        07/17/23 1137             Note:  This document was prepared using Dragon voice recognition software and may include unintentional dictation errors.   Janith Lima, MD 07/17/23 1146

## 2023-07-17 NOTE — ED Triage Notes (Signed)
[facility] and diagnosed with an eye infection to his right eye.Pt was given ciprofloxacin and has been using as prescribed but reports he woke up this am and had pain to the right side of his head and is concerned they are related.

## 2023-09-15 IMAGING — CT CT CHEST LUNG CANCER SCREENING LOW DOSE W/O CM
1 series · 10 of 10 positions shown, 13 images · non-contrast
Comparison: 08/05/2020

CLINICAL DATA: Thirty-nine pack-year smoking history. Current
smoker.



[ct lung segmentation data · axial · 0.73mm/px · z∈[-269,-269]mm · 10 of 255 frames shown]
[frame 1/255  mediastinal]
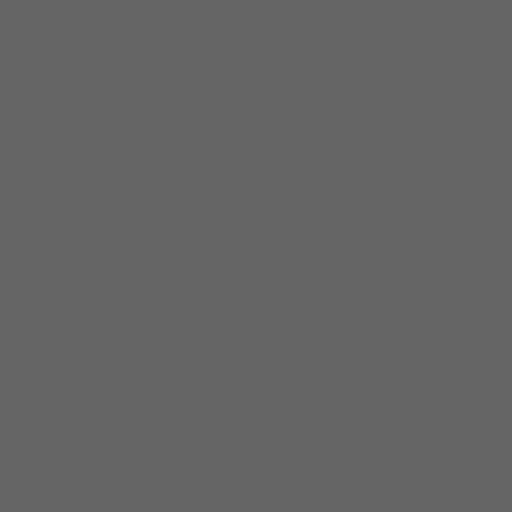
[frame 1/255  lung]
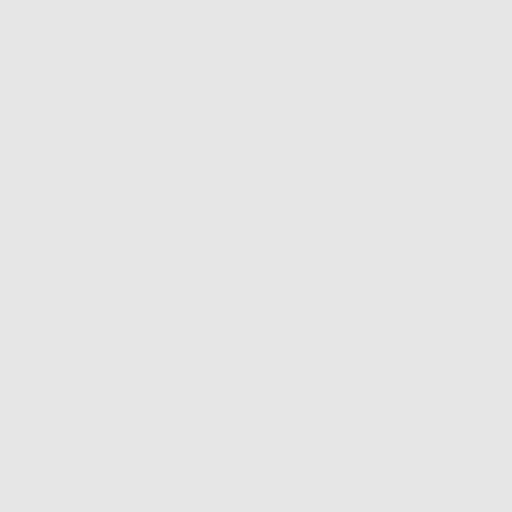
[frame 29/255  lung]
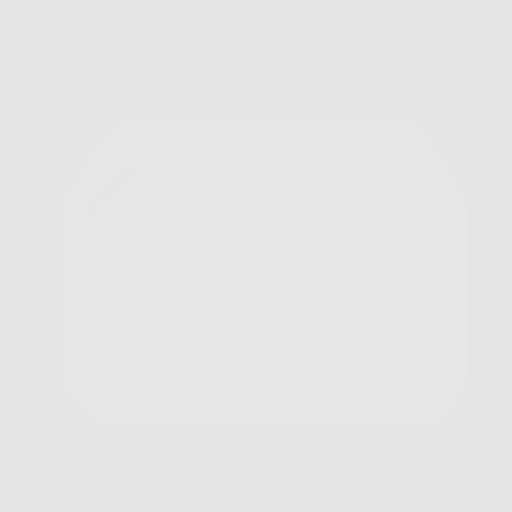
[frame 57/255  lung]
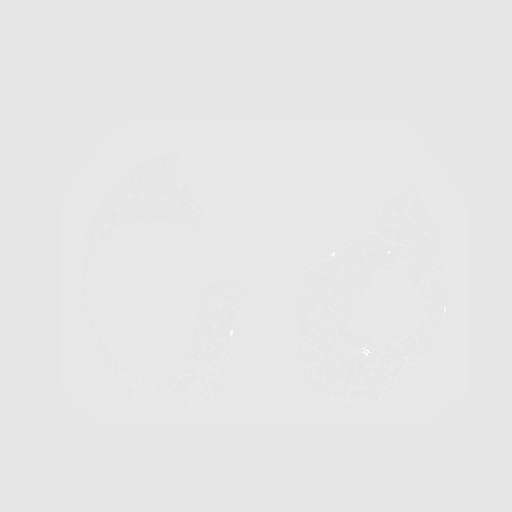
[frame 85/255  lung]
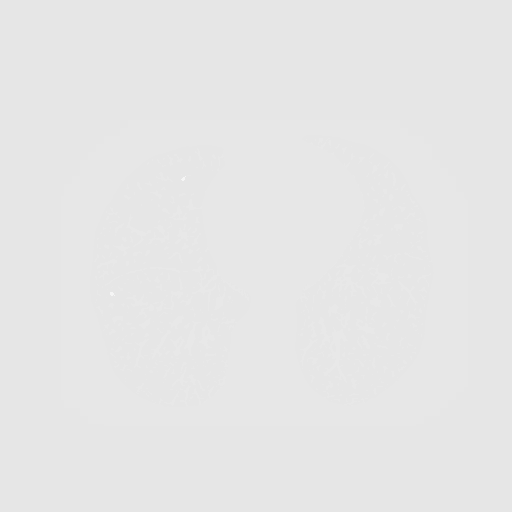
[frame 113/255  mediastinal]
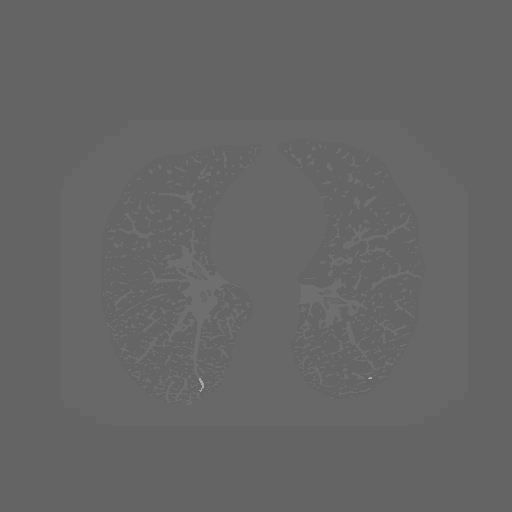
[frame 113/255  lung]
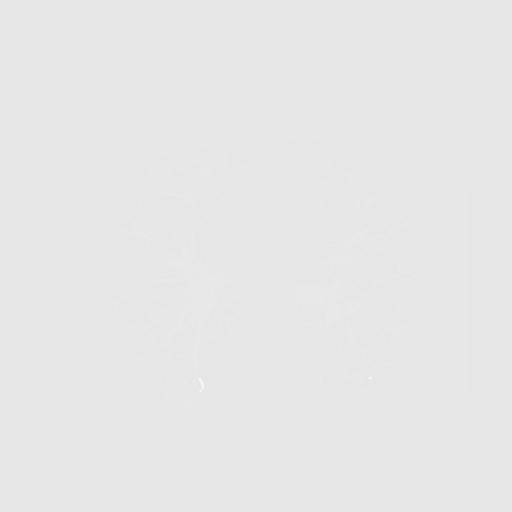
[frame 142/255  lung]
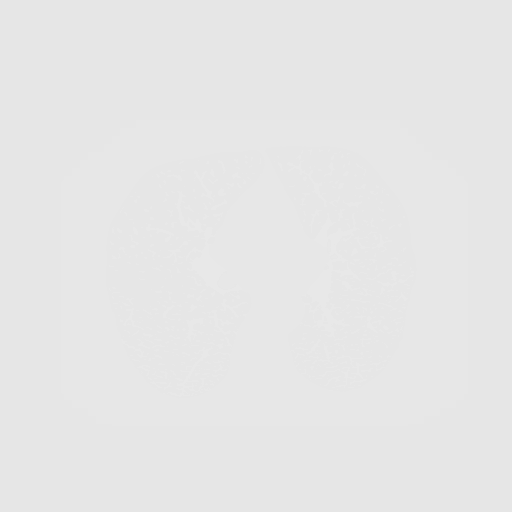
[frame 170/255  lung]
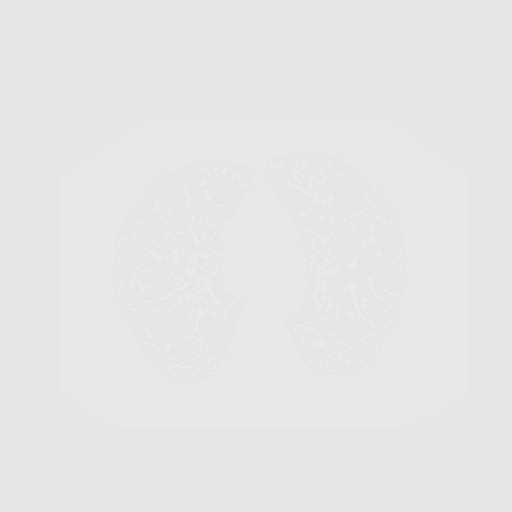
[frame 198/255  lung]
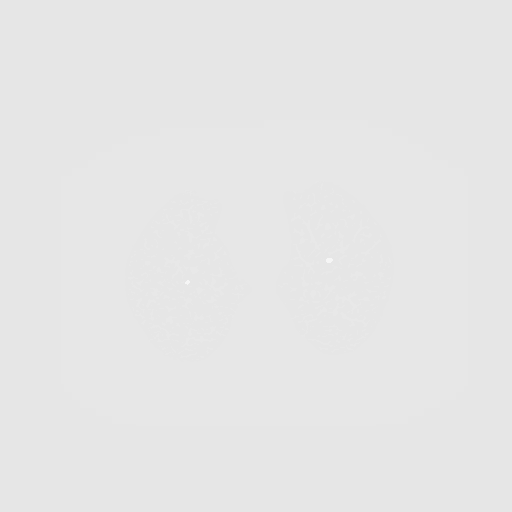
[frame 226/255  mediastinal]
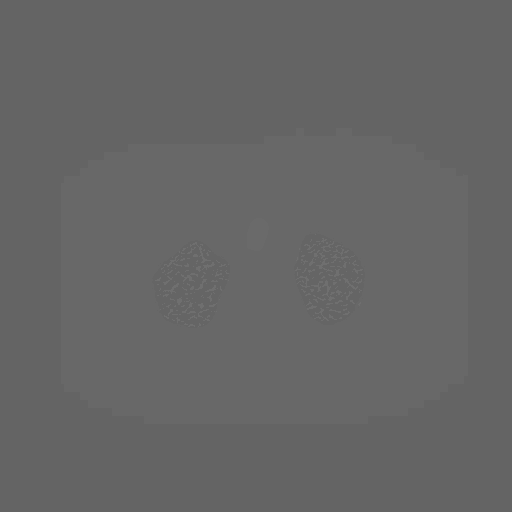
[frame 226/255  lung]
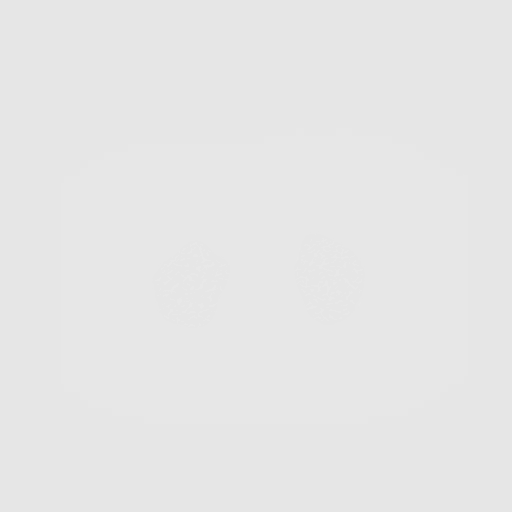
[frame 255/255  lung]
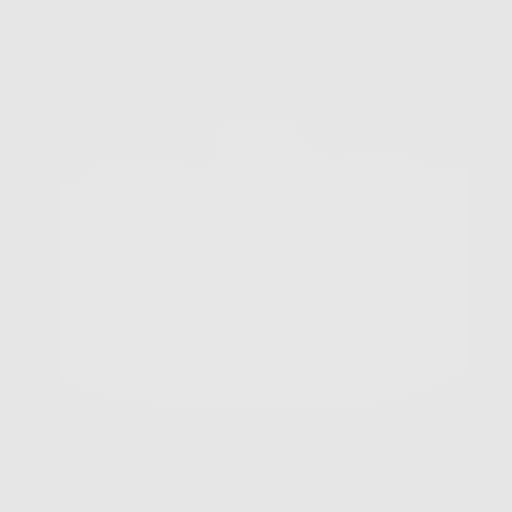

[10 of 10 positions shown; findings below may reference images not displayed]

FINDINGS: Cardiovascular: Aortic atherosclerosis. Normal heart size, without
pericardial effusion. Lad coronary artery calcification. No
mediastinal or definite hilar adenopathy, given limitations of
unenhanced CT.

Mediastinum/Nodes: No mediastinal or definite hilar adenopathy,
given limitations of unenhanced CT.

Lungs/Pleura: No pleural fluid. Mild centrilobular emphysema.
Pulmonary nodules of maximally volume derived equivalent diameter
5.5 mm, similar.

Upper Abdomen: Normal imaged portions of the spleen, stomach. Mild
hepatic steatosis.

Musculoskeletal: Right rotator cuff repair and lower cervical spine
fixation.
IMPRESSION: 1. Lung-RADS 2, benign appearance or behavior. Continue annual
screening with low-dose chest CT without contrast in 12 months.
2. Hepatic steatosis.
3. Aortic Atherosclerosis (ZUJPE-JHE.E) and Emphysema ([id number]-SN6.E).
Coronary artery atherosclerosis.

## 2023-11-22 ENCOUNTER — Other Ambulatory Visit: Payer: Self-pay | Admitting: Acute Care

## 2023-11-22 DIAGNOSIS — Z122 Encounter for screening for malignant neoplasm of respiratory organs: Secondary | ICD-10-CM

## 2023-11-22 DIAGNOSIS — F17210 Nicotine dependence, cigarettes, uncomplicated: Secondary | ICD-10-CM

## 2023-11-22 DIAGNOSIS — Z87891 Personal history of nicotine dependence: Secondary | ICD-10-CM

## 2023-11-29 ENCOUNTER — Inpatient Hospital Stay: Admission: RE | Admit: 2023-11-29 | Source: Ambulatory Visit

## 2023-12-13 NOTE — Progress Notes (Signed)
 Chief Complaint  Patient presents with  . Follow-up    HPI  Derek Fields is a 63 y.o. 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 and PSA: 0.47  ROS Rest of 10 point review of systems is normal.  Outpatient Encounter Medications as of 12/13/2023  Medication Sig Dispense Refill  . albuterol  90 mcg/actuation inhaler Inhale 2 inhalations into the lungs every 6 (six) hours as needed for Wheezing 1 each 2  . amLODIPine -valsartan (EXFORGE) 10-160 mg tablet Take 1 tablet by mouth once daily 30 tablet 0  . cyanocobalamin (VITAMIN B12) 1000 MCG tablet TAKE 1 TABLET BY MOUTH ONCE DAILY 90 tablet 1  . esomeprazole (NEXIUM) 40 MG DR capsule TAKE 1 CAPSULE BY MOUTH EVERY DAY 90 capsule 4  . nitroGLYcerin (NITROSTAT) 0.4 MG SL tablet Place 1 tablet (0.4 mg total) under the tongue every 5 (five) minutes as needed for Chest pain May take up to 3 doses. 25 tablet 11  . aspirin  (ASPIR-81 ORAL) Take by mouth (Patient not taking: Reported on 11/25/2022)    . colestipoL (COLESTID) 1 gram tablet TAKE 2 TABLETS BY MOUTH 2 TIMES DAILY. (Patient not taking: Reported on 12/13/2023) 360 tablet 1  . ergocalciferol, vitamin D2, 1,250 mcg (50,000 unit) capsule Take 1 capsule (50,000 Units total) by mouth once a week for 180 days (Patient not taking: Reported on 12/13/2023) 12 capsule 1  . gemfibroziL (LOPID) 600 mg tablet Take 1 tablet (600 mg  total) by mouth 2 (two) times daily before meals (Patient not taking: Reported on 12/13/2023) 180 tablet 3  . rosuvastatin (CRESTOR) 40 MG tablet Take 1 tablet (40 mg total) by mouth once daily (Patient not taking: Reported on 12/13/2023) 90 tablet 3  . TRELEGY ELLIPTA  100-62.5-25 mcg inhaler TAKE 1 PUFF BY MOUTH EVERY DAY (Patient not taking: Reported on 12/13/2023)     No facility-administered encounter medications on file as of 12/13/2023.    Allergies as of 12/13/2023  . (No Known Allergies)    Past Medical History:  Diagnosis Date  . Hyperlipidemia     Past Surgical History:  Procedure Laterality Date  . Back surgery  08/10/2008   replace 5:6  . Colon @ [facility]  05/06/2022   Tubular adenoma/Hyperplastic polyp/Repeat 1 year due to poor prep/SMR    Vitals:   12/13/23 1451  BP: 130/70  Pulse: 74   Body mass index is 26.73 kg/m.   No visits with results within 3 Month(s) from this visit.  Latest known visit with results is:  Appointment on 11/25/2022  Component Date Value Ref Range Status  . WBC (White Blood Cell Count) 11/25/2022 6.8  4.1 - 10.2 10^3/uL Final  . RBC (Red Blood Cell Count) 11/25/2022 5.18  4.69 - 6.13 10^6/uL Final  . Hemoglobin 11/25/2022 17.8  14.1 - 18.1 gm/dL Final  . Hematocrit 95/82/7975 51.0  40.0 - 52.0 % Final  . MCV (Mean Corpuscular Volume) 11/25/2022 98.5  80.0 - 100.0 fl Final  . MCH (Mean Corpuscular Hemoglobin) 11/25/2022 34.4 (H)  27.0 - 31.2 pg Final  . MCHC (Mean Corpuscular Hemoglobin * 11/25/2022 34.9  32.0 - 36.0 gm/dL Final  . Platelet Count 11/25/2022 290  150 - 450 10^3/uL Final  . RDW-CV (Red Cell Distribution Widt* 11/25/2022 13.4  11.6 - 14.8 % Final  . MPV (Mean Platelet Volume) 11/25/2022 9.5  9.4 - 12.4 fl Final  . Neutrophils 11/25/2022 3.64  1.50 - 7.80 10^3/uL Final  . Lymphocytes 11/25/2022 2.24  1.00 - 3.60 10^3/uL Final  . Monocytes 11/25/2022 0.77  0.00 - 1.50 10^3/uL Final  . Eosinophils 11/25/2022 0.13  0.00 - 0.55 10^3/uL  Final  . Basophils 11/25/2022 0.04  0.00 - 0.09 10^3/uL Final  . Neutrophil % 11/25/2022 53.2  32.0 - 70.0 % Final  . Lymphocyte % 11/25/2022 32.7  10.0 - 50.0 % Final  . Monocyte % 11/25/2022 11.3  4.0 - 13.0 % Final  . Eosinophil % 11/25/2022 1.9  1.0 - 5.0 % Final  . Basophil% 11/25/2022 0.6  0.0 - 2.0 % Final  . Immature Granulocyte % 11/25/2022 0.3  <=0.7 % Final  . Immature Granulocyte Count 11/25/2022 0.02  <=0.06 10^3/L Final  . Glucose 11/25/2022 96  70 - 110 mg/dL Final  . Sodium 95/82/7975 138  136 - 145 mmol/L Final  . Potassium 11/25/2022 4.5  3.6 - 5.1 mmol/L Final  . Chloride 11/25/2022 101  97 - 109 mmol/L Final  . Carbon Dioxide (CO2) 11/25/2022 28.7  22.0 - 32.0 mmol/L Final  . Urea Nitrogen (BUN) 11/25/2022 7  7 - 25 mg/dL Final  . Creatinine 95/82/7975 1.2  0.7 - 1.3 mg/dL Final  . Glomerular Filtration Rate (eGFR) 11/25/2022 70  >60 mL/min/1.73sq m Final   CKD-EPI (2021) does not include patient's race in the calculation of eGFR.  Monitoring changes of plasma creatinine and eGFR over time is useful for monitoring kidney function.   Interpretive Ranges for eGFR (CKD-EPI 2021):  eGFR:       >60 mL/min/1.73 sq. m - Normal eGFR:       30-59 mL/min/1.73 sq. m - Moderately Decreased eGFR:       15-29 mL/min/1.73 sq. m  - Severely Decreased eGFR:       < 15 mL/min/1.73 sq. m  - Kidney Failure    Note: These eGFR calculations do not apply in acute situations when eGFR is changing rapidly or patients on dialysis.  . Calcium 11/25/2022 9.6  8.7 - 10.3 mg/dL Final  . AST  95/82/7975 25  8 - 39 U/L Final  . ALT  11/25/2022 18  6 - 57 U/L Final  . Alk Phos (alkaline Phosphatase) 11/25/2022 90  34 - 104 U/L Final  . Albumin 11/25/2022 4.5  3.5 - 4.8 g/dL Final  . Bilirubin, Total 11/25/2022 0.6  0.3 - 1.2 mg/dL Final  . Protein, Total 11/25/2022 7.0  6.1 - 7.9 g/dL Final  . A/G Ratio 95/82/7975 1.8  1.0 - 5.0 gm/dL Final  . Hemoglobin J8R 11/25/2022 5.8 (H)  4.2 - 5.6 %  Final  . Average Blood Glucose (Calc) 11/25/2022 120  mg/dL Final  . Cholesterol, Total 11/25/2022 350 (H)  100 - 200 mg/dL Final  . Triglyceride 95/82/7975 559 (H)  35 - 199 mg/dL Final   Calculated  LDL and VLDL not reported due to Triglyceride >400 mg/dL.  SABRA HDL (High Density Lipoprotein) Cho* 11/25/2022 56.6  29.0 - 71.0 mg/dL Final  . Cholesterol/HDL Ratio 11/25/2022 6.2   Final  . Creatinine, Random Urine 11/25/2022 89.9  40.0 - 300.0 mg/dL Final  . Urine Albumin, Random 11/25/2022 19    mg/L Final  . Urine Albumin/Creatinine Ratio 11/25/2022 21.1  <30.0 ug/mg Final   Urine:         Spot collection              (g/mg creatinine)     Normal               < 30   Moderately          30-299         increased   Clinical             >=300 albuminuria  . PSA (Prostate Specific Antigen), T* 11/25/2022 0.94  0.10 - 4.00 ng/mL Final  . Thyroid Stimulating Hormone (TSH) 11/25/2022 2.617  0.450-5.330 uIU/ml uIU/mL Final  . Color 11/25/2022 Light Yellow  Colorless, Straw, Light Yellow, Yellow, Dark Yellow Final  . Clarity 11/25/2022 Clear  Clear Final  . Specific Gravity 11/25/2022 1.010  1.005 - 1.030 Final  . pH, Urine 11/25/2022 7.5  5.0 - 8.0 Final  . Protein, Urinalysis 11/25/2022 Negative  Negative mg/dL Final  . Glucose, Urinalysis 11/25/2022 Negative  Negative mg/dL Final  . Ketones, Urinalysis 11/25/2022 Negative  Negative mg/dL Final  . Blood, Urinalysis 11/25/2022 Negative  Negative Final  . Nitrite, Urinalysis 11/25/2022 Negative  Negative Final  . Leukocyte Esterase, Urinalysis 11/25/2022 Negative  Negative Final  . Bilirubin, Urinalysis 11/25/2022 Negative  Negative Final  . Urobilinogen, Urinalysis 11/25/2022 0.2  0.2 - 1.0 mg/dL Final  . WBC, UA 95/82/7975 1  <=5 /hpf Final  . Red Blood Cells, Urinalysis 11/25/2022 1  <=3 /hpf Final  . Bacteria, Urinalysis 11/25/2022 0-5  0 - 5 /hpf Final  . Squamous Epithelial Cells, Urinaly* 11/25/2022 0  /hpf Final     Exam    Blood pressure 130/70, pulse 74, weight 72.8 kg (160 lb 9.6 oz), SpO2 98%.   Wt Readings from Last 3 Encounters:  12/13/23 72.8 kg (160 lb 9.6 oz)  11/25/22 73.5 kg (162 lb)  05/08/22 76.1 kg (167 lb 12.8 oz)   General. Alert oriented x3  Skin. No suspicious lesions or moles.   Eyes. Sclera and conjunctiva clear; pupils equal round and reactive to light ;; extraocular movements intact Ears. External normal; canals clear; tympanic membranes normal Nose. Mucosa healthy without drainage or ulceration Oropharynx. Congested  Neck. No swelling, masses, stiffness, pain, limited movement, carotid pulses normal bilaterally, thyroid normal size, no masses palpated.  No bruits Lungs. Respirations unlabored; Bilat expiratory wheezes noted  Back. No spinal deformity Cardiovascular. Heart regular rate and rhythm without murmurs, gallops, or rubs Abdomen. Soft; Non tender  Noted  No rebound or guarding non distended; normoactive bowel sounds; no masses or organomegaly RECTAL: Declined  Lymph Nodes. No significant cervical, supraclavicular, axillary or inguinal lymphadenopathy noted Musculoskeletal. Left shoulder pain on external rotation. Extremities. Normal, no edema Neurologic. Alert and oriented; speech intact; face symmetrical; moves all extremities well   Assessment and Plan   1 HTN: Currently not on meds Advised to d/c Exforge Start Amlodipine  5 mg po qd  Low sodium diet  Monitor 2 Pain left shoulder;Declines x ray or MRI at this time  Call back  if symptoms persist   3 Atypical Chest pain: resolved  Cardiac cath June 2022 -was advised medical management    4 BPH with LUTS;s/p TURP- doing better  5 Mixed hyperlipidemia; On Gemfibrozil  and Crestor   - Has been non compliant  5 Tobacco use/ COPD:DOE Fatigue  Advised to quit  Low dose CT Chest:April 2024 1. Lung-RADS 2S, benign appearance or behavior. Continue annual  screening with low-dose chest CT without contrast  in 12 months.  2. The S modifier above refers to potentially clinically  significant non lung cancer related findings. Specifically, there is  aortic atherosclerosis, in addition to left main and three-vessel  coronary artery disease. Please note that although the presence of  coronary artery calcium documents the presence of coronary artery  disease, the severity of this disease and any potential stenosis  cannot be assessed on this non-gated CT examination. Assessment for  potential risk factor modification, dietary therapy or pharmacologic  therapy may be warranted, if clinically indicated.  3. Mild diffuse bronchial wall thickening with mild centrilobular  and paraseptal emphysema; imaging findings suggestive of underlying  COPD.   Aortic Atherosclerosis (ICD10-I70.0) and Emphysema (ICD10-J43.9).  Will have it repeated later this month 6 Microscopic hematuriaHx of TURP for BPH in Aug 2019 (Dr. Francisca)   7 Erectile Dysfunction; Declines meds  8  Health Maintenance: Declined Flu shot and Pneuimovax Up to date with COVID vaccine  Colonoscopy approx 5 yrs back- Reportedly normal   Repeated on 05/06/22;& polyps removed-  Divertics and Internal hemorrhoids Decrease intake of alcohol. Discussed results of labs  Quit smoking completely- Advised to use Nicoderm CQ patches  Labs 1 week prior to next visit Follow up in 6 months   Tamra Leventhal  MD

## 2024-06-03 ENCOUNTER — Emergency Department

## 2024-06-03 ENCOUNTER — Emergency Department
Admission: EM | Admit: 2024-06-03 | Discharge: 2024-06-03 | Disposition: A | Discharge status: Home / Self Care | Attending: Emergency Medicine | Admitting: Emergency Medicine

## 2024-06-03 ENCOUNTER — Other Ambulatory Visit: Payer: Self-pay

## 2024-06-03 DIAGNOSIS — S46912A Strain of unspecified muscle, fascia and tendon at shoulder and upper arm level, left arm, initial encounter: Secondary | ICD-10-CM | POA: Diagnosis not present

## 2024-06-03 DIAGNOSIS — S4992XA Unspecified injury of left shoulder and upper arm, initial encounter: Secondary | ICD-10-CM | POA: Diagnosis present

## 2024-06-03 DIAGNOSIS — I10 Essential (primary) hypertension: Secondary | ICD-10-CM | POA: Insufficient documentation

## 2024-06-03 DIAGNOSIS — W11XXXA Fall on and from ladder, initial encounter: Secondary | ICD-10-CM | POA: Insufficient documentation

## 2024-06-03 MED ORDER — OXYCODONE-ACETAMINOPHEN 5-325 MG PO TABS: 1.0000 | ORAL_TABLET | ORAL | 0 refills | Status: AC | PRN

## 2024-06-03 MED ORDER — KETOROLAC TROMETHAMINE 30 MG/ML IJ SOLN
30.0000 mg | Freq: Once | INTRAMUSCULAR | Status: AC
  Administered 2024-06-03: 30 mg via INTRAMUSCULAR
  Filled 2024-06-03: qty 1

## 2024-06-03 MED ORDER — HYDROMORPHONE HCL 1 MG/ML IJ SOLN
1.0000 mg | Freq: Once | INTRAMUSCULAR | Status: AC
  Administered 2024-06-03: 1 mg via INTRAMUSCULAR
  Filled 2024-06-03: qty 1

## 2024-06-03 MED ORDER — OXYCODONE-ACETAMINOPHEN 5-325 MG PO TABS
1.0000 | ORAL_TABLET | Freq: Once | ORAL | Status: AC
  Administered 2024-06-03: 1 via ORAL
  Filled 2024-06-03: qty 1

## 2024-06-03 NOTE — ED Triage Notes (Signed)
 Pt to ED POV with wife for L shoulder injury. Pt fell onto L arm yesterday, 3 steps up on a ladder. Pain is to L shoulder, down whole arm, and L clavicle. No obvious deformities. Pt is holding L arm in position of comfort and is not moving arm.

## 2024-06-03 NOTE — Discharge Instructions (Signed)
 You can keep the sling on as needed for comfort and support over the next several days.  Apply ice to the shoulder.  Make an appointment to follow-up with the orthopedist.  Return to the ER for new, worsening, or persistent severe pain, weakness or numbness in the arm, or any other new or worsening symptoms that concern you.

## 2024-06-03 NOTE — ED Provider Notes (Signed)
 [facility] Provider Note    Event Date/Time   First MD Initiated Contact with Patient 06/03/24 [id number]     (approximate)   History   Shoulder Injury   HPI  Derek Fields is a 63 y.o. male with a history of hypertension and BPH who presents with a left shoulder injury, acute onset yesterday when he fell off of a ladder from about 3 or 4 feet up.  He states that he put his left arm out to break his fall, and had a direct impact.  He did not hit his head.  She denies any other injuries.  He has no weakness or numbness in the arm.  I reviewed the past medical records.  The patient's most recent outpatient encounter was with internal medicine on 5/5 for follow-up of his chronic conditions.   Physical Exam   Triage Vital Signs: ED Triage Vitals  Encounter Vitals Group     BP 06/03/24 0733 111/75     Girls Systolic BP Percentile --      Girls Diastolic BP Percentile --      Boys Systolic BP Percentile --      Boys Diastolic BP Percentile --      Pulse Rate 06/03/24 0733 73     Resp 06/03/24 0733 18     Temp 06/03/24 0733 (!) 97.5 F (36.4 C)     Temp Source 06/03/24 0733 Oral     SpO2 06/03/24 0733 100 %     Weight 06/03/24 0735 165 lb (74.8 kg)     Height 06/03/24 0735 5' 5 (1.651 m)     Head Circumference --      Peak Flow --      Pain Score 06/03/24 0734 10     Pain Loc --      Pain Education --      Exclude from Growth Chart --     Most recent vital signs: Vitals:   06/03/24 0957 06/03/24 1000  BP: (!) 121/58 107/70  Pulse: (!) 58 63  Resp: 16   Temp:    SpO2: 98% 100%     General: Awake, no distress.  CV:  Good peripheral perfusion.  Resp:  Normal effort.  Abd:  No distention.  Other:  Left shoulder with tenderness anteriorly.  2+ radial pulse.  Motor and sensory intact in median, radial, and ulnar distributions.   ED Results / Procedures / Treatments   Labs (all labs ordered are listed, but only abnormal results are  displayed) Labs Reviewed - No data to display   EKG     RADIOLOGY  XR L shoulder: I independently viewed and interpreted the images; there is no acute fracture or other acute abnormality  CT L shoulder: No acute traumatic findings  PROCEDURES:  Critical Care performed: No  Procedures   MEDICATIONS ORDERED IN ED: Medications  HYDROmorphone (DILAUDID) injection 1 mg (1 mg Intramuscular Given 06/03/24 0750)  ketorolac (TORADOL) 30 MG/ML injection 30 mg (30 mg Intramuscular Given 06/03/24 1000)  oxyCODONE -acetaminophen  (PERCOCET/ROXICET) 5-325 MG per tablet 1 tablet (1 tablet Oral Given 06/03/24 1029)     IMPRESSION / MDM / ASSESSMENT AND PLAN / ED COURSE  I reviewed the triage vital signs and the nursing notes.   Differential diagnosis includes, but is not limited to, proximal humerus fracture, clavicle fracture, AC separation, shoulder dislocation.  We will give analgesia and obtain x-rays for further evaluation.  Patient's presentation is most consistent with acute complicated illness /  injury requiring diagnostic workup.  ----------------------------------------- 11:00 AM on 06/03/2024 -----------------------------------------  X-rays were negative.  Due to the degree of the patient's pain I obtained a CT to make sure that he did not have an occult fracture that was not seen clearly on the x-ray.  The patient's tenderness is localized to the anterior shoulder itself.  There is no evidence of a cervical spinal or rib injury.  On reassessment, the patient is comfortable.  CT is negative.  Overall I suspect a likely contusion or possible ligamentous injury.  At this time the patient is stable for discharge.  I have provided him a sling for comfort and prescribed analgesia.  I have given orthopedic referral.  I counseled the patient on the results of the workup and plan of care.  I gave strict return precautions, and he expressed understanding.    FINAL CLINICAL  IMPRESSION(S) / ED DIAGNOSES   Final diagnoses:  Strain of left shoulder, initial encounter     Rx / DC Orders   ED Discharge Orders          Ordered    oxyCODONE -acetaminophen  (PERCOCET) 5-325 MG tablet  Every 4 hours PRN        06/03/24 1059             Note:  This document was prepared using Dragon voice recognition software and may include unintentional dictation errors.    Jacolyn Pae, MD 06/03/24 1101

## 2024-06-05 ENCOUNTER — Ambulatory Visit
Admission: RE | Admit: 2024-06-05 | Discharge: 2024-06-05 | Disposition: A | Discharge status: Home / Self Care | Source: Ambulatory Visit | Attending: Physician Assistant | Admitting: Physician Assistant

## 2024-06-05 ENCOUNTER — Other Ambulatory Visit: Payer: Self-pay | Admitting: Physician Assistant

## 2024-06-05 DIAGNOSIS — R1013 Epigastric pain: Secondary | ICD-10-CM

## 2024-06-05 DIAGNOSIS — R079 Chest pain, unspecified: Secondary | ICD-10-CM
